# Patient Record
Sex: Female | Born: 1990 | Race: Black or African American | Hispanic: No | Marital: Single | Smoking: Former smoker
Health system: Southern US, Community
[De-identification: ages and names within clinical notes are randomized; demographics above are authoritative.]

## PROBLEM LIST (undated history)

## (undated) DIAGNOSIS — R569 Unspecified convulsions: Secondary | ICD-10-CM

---
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
## 2009-05-10 ENCOUNTER — Inpatient Hospital Stay (HOSPITAL_COMMUNITY): Admission: AD | Admit: 2009-05-10 | Discharge: 2009-05-10 | Payer: Self-pay | Admitting: Obstetrics and Gynecology

## 2009-05-15 ENCOUNTER — Inpatient Hospital Stay (HOSPITAL_COMMUNITY): Admission: RE | Admit: 2009-05-15 | Discharge: 2009-05-15 | Payer: Self-pay | Admitting: Obstetrics and Gynecology

## 2009-05-19 ENCOUNTER — Inpatient Hospital Stay (HOSPITAL_COMMUNITY): Admission: AD | Admit: 2009-05-19 | Discharge: 2009-05-19 | Payer: Self-pay | Admitting: Obstetrics and Gynecology

## 2009-05-20 ENCOUNTER — Inpatient Hospital Stay (HOSPITAL_COMMUNITY): Admission: AD | Admit: 2009-05-20 | Discharge: 2009-05-23 | Payer: Self-pay | Admitting: Obstetrics and Gynecology

## 2009-05-20 ENCOUNTER — Inpatient Hospital Stay (HOSPITAL_COMMUNITY): Admission: AD | Admit: 2009-05-20 | Discharge: 2009-05-20 | Payer: Self-pay | Admitting: Obstetrics and Gynecology

## 2010-07-01 LAB — COMPREHENSIVE METABOLIC PANEL
Albumin: 2.9 g/dL — ABNORMAL LOW (ref 3.5–5.2)
Calcium: 9 mg/dL (ref 8.4–10.5)
Chloride: 103 mEq/L (ref 96–112)
GFR calc Af Amer: >60 mL/min (ref >60)
GFR calc non Af Amer: >60 mL/min (ref >60)
Glucose, Bld: 101 mg/dL — ABNORMAL HIGH (ref 70–99)
Potassium: 3.8 mEq/L (ref 3.5–5.1)
Sodium: 132 mEq/L — ABNORMAL LOW (ref 135–145)
Total Bilirubin: 0.4 mg/dL (ref 0.3–1.2)
Total Protein: 6.4 g/dL (ref 6.0–8.3)

## 2010-07-01 LAB — CBC
MCHC: 33.2 g/dL (ref 30.0–36.0)
MCV: 87 fL (ref 78.0–100.0)
Platelets: 230 10*3/uL (ref 150–400)
RDW: 13.4 % (ref 11.5–15.5)
WBC: 12 10*3/uL — ABNORMAL HIGH (ref 4.0–10.5)

## 2010-07-04 LAB — CCBB MATERNAL DONOR DRAW

## 2010-07-04 LAB — COMPREHENSIVE METABOLIC PANEL
ALT: 21 U/L (ref 0–35)
AST: 26 U/L (ref 0–37)
Albumin: 3.3 g/dL — ABNORMAL LOW (ref 3.5–5.2)
Alkaline Phosphatase: 170 U/L — ABNORMAL HIGH (ref 39–117)
GFR calc Af Amer: >60 mL/min (ref >60)
GFR calc non Af Amer: >60 mL/min (ref >60)
Glucose, Bld: 96 mg/dL (ref 70–99)
Total Bilirubin: 0.1 mg/dL — ABNORMAL LOW (ref 0.3–1.2)
Total Protein: 7.1 g/dL (ref 6.0–8.3)

## 2010-07-04 LAB — CBC
HCT: 38 % (ref 36.0–46.0)
MCV: 88.6 fL (ref 78.0–100.0)
Platelets: 200 10*3/uL (ref 150–400)
Platelets: 258 10*3/uL (ref 150–400)
RBC: 3.6 MIL/uL — ABNORMAL LOW (ref 3.87–5.11)
RBC: 4.32 MIL/uL (ref 3.87–5.11)
RDW: 14.3 % (ref 11.5–15.5)
WBC: 20.2 10*3/uL — ABNORMAL HIGH (ref 4.0–10.5)

## 2010-07-04 LAB — RPR: RPR Ser Ql: NONREACTIVE

## 2010-07-04 LAB — LACTATE DEHYDROGENASE: LDH: 174 U/L (ref 94–250)

## 2010-07-04 LAB — URIC ACID: Uric Acid, Serum: 4.3 mg/dL (ref 2.4–7.0)

## 2010-07-22 LAB — URINALYSIS, ROUTINE W REFLEX MICROSCOPIC
Bilirubin Urine: NEGATIVE
Glucose, UA: NEGATIVE mg/dL
Ketones, ur: >80 mg/dL — AB
Specific Gravity, Urine: 1.028 (ref 1.005–1.030)
pH: 6.5 (ref 5.0–8.0)

## 2010-07-22 LAB — PREGNANCY, URINE: Preg Test, Ur: POSITIVE

## 2010-07-22 LAB — URINE MICROSCOPIC-ADD ON

## 2010-07-22 LAB — WET PREP, GENITAL

## 2010-08-21 ENCOUNTER — Emergency Department (HOSPITAL_COMMUNITY)
Admission: EM | Admit: 2010-08-21 | Discharge: 2010-08-21 | Disposition: A | Discharge status: Home / Self Care | Payer: Self-pay | Attending: Emergency Medicine | Admitting: Emergency Medicine

## 2010-08-21 DIAGNOSIS — A5903 Trichomonal cystitis and urethritis: Secondary | ICD-10-CM | POA: Insufficient documentation

## 2010-08-21 DIAGNOSIS — N949 Unspecified condition associated with female genital organs and menstrual cycle: Secondary | ICD-10-CM | POA: Insufficient documentation

## 2010-08-21 LAB — URINE MICROSCOPIC-ADD ON

## 2010-08-21 LAB — URINALYSIS, ROUTINE W REFLEX MICROSCOPIC
Bilirubin Urine: NEGATIVE
Glucose, UA: NEGATIVE mg/dL
Specific Gravity, Urine: 1.021 (ref 1.005–1.030)
Urobilinogen, UA: 0.2 mg/dL (ref 0.0–1.0)
pH: 7 (ref 5.0–8.0)

## 2011-08-08 ENCOUNTER — Emergency Department (INDEPENDENT_AMBULATORY_CARE_PROVIDER_SITE_OTHER)
Admission: EM | Admit: 2011-08-08 | Discharge: 2011-08-08 | Disposition: A | Discharge status: Home / Self Care | Payer: Self-pay | Source: Home / Self Care | Attending: Emergency Medicine | Admitting: Emergency Medicine

## 2011-08-08 ENCOUNTER — Emergency Department (INDEPENDENT_AMBULATORY_CARE_PROVIDER_SITE_OTHER): Payer: Self-pay

## 2011-08-08 ENCOUNTER — Encounter (HOSPITAL_COMMUNITY): Payer: Self-pay | Admitting: Emergency Medicine

## 2011-08-08 DIAGNOSIS — S0033XA Contusion of nose, initial encounter: Secondary | ICD-10-CM

## 2011-08-08 DIAGNOSIS — S1093XA Contusion of unspecified part of neck, initial encounter: Secondary | ICD-10-CM

## 2011-08-08 DIAGNOSIS — S0003XA Contusion of scalp, initial encounter: Secondary | ICD-10-CM

## 2011-08-08 MED ORDER — IBUPROFEN 600 MG PO TABS: 600.0000 mg | ORAL_TABLET | Freq: Four times a day (QID) | ORAL | Status: AC | PRN

## 2011-08-08 MED ORDER — HYDROCODONE-ACETAMINOPHEN 5-325 MG PO TABS: 2.0000 | ORAL_TABLET | ORAL | Status: AC | PRN
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
## 2011-08-08 NOTE — ED Notes (Signed)
Pt spoke with social worker per [facility] and given resources to f/u with

## 2011-08-08 NOTE — Discharge Instructions (Signed)
Please use resources that the social worker gave you. Return here if you've a fever above 100.4, severe headache, trouble seeing, or for any other concerns.  Call the family practice center on the 1st day of each month. they take the first 60 people as patients. you do not need insurance to be seen.   Go to www.goodrx.com to look up your medications. This will give you a list of where you can find your prescriptions at the most affordable prices.   RESOURCE GUIDE   No Primary Care Doctor Call [facility]  [phone] Other agencies that provide inexpensive medical care    [facility]  [phone]    [facility]  [phone]    [facility]  [phone]    [facility]  [phone] [address] [place] [address]    [facility]  [phone]    [facility]  [phone] [facility] [phone]   [address] [place] Suite [place], [place] [address]   [facility] Resources  [facility]     [employer]                          [facility] [address] [place]                       [address]      [address] [place] [address]   [phone] (After Hours)

## 2012-08-08 ENCOUNTER — Encounter (HOSPITAL_COMMUNITY): Payer: Self-pay | Admitting: *Deleted

## 2012-08-08 ENCOUNTER — Emergency Department (HOSPITAL_COMMUNITY)
Admission: EM | Admit: 2012-08-08 | Discharge: 2012-08-08 | Disposition: A | Discharge status: Home / Self Care | Payer: Self-pay | Attending: Emergency Medicine | Admitting: Emergency Medicine

## 2012-08-08 DIAGNOSIS — F17200 Nicotine dependence, unspecified, uncomplicated: Secondary | ICD-10-CM | POA: Insufficient documentation

## 2012-08-08 DIAGNOSIS — H109 Unspecified conjunctivitis: Secondary | ICD-10-CM | POA: Insufficient documentation

## 2012-08-08 DIAGNOSIS — H5789 Other specified disorders of eye and adnexa: Secondary | ICD-10-CM | POA: Insufficient documentation

## 2012-08-08 MED ORDER — TOBRAMYCIN 0.3 % OP OINT
TOPICAL_OINTMENT | Freq: Four times a day (QID) | OPHTHALMIC | Status: DC
  Administered 2012-08-08: 22:00:00 via OPHTHALMIC

## 2012-08-08 NOTE — ED Notes (Signed)
Red irritated  Lt eye for 2 days..  Not itching

## 2012-08-08 NOTE — ED Notes (Signed)
The patient is AOx4 and comfortable with her discharge instructions. 

## 2012-08-08 NOTE — ED Provider Notes (Signed)
History    This chart was scribed for Clinton Sawyer, a non-physician practitioner working with Raeford Razor, MD by Lewanda Rife, ED Scribe. This patient was seen in room TR04C/TR04C and the patient's care was started at 2125.     CSN: [id number]  Arrival date & time 08/08/12  1859   First MD Initiated Contact with Patient 08/08/12 2056      Chief Complaint  Patient presents with  . Eye Pain    (Consider location/radiation/quality/duration/timing/severity/associated sxs/prior treatment) HPI Wendy Stephenson is a 22 y.o. female who presents to the Emergency Department complaining of constant moderate left eye drainage onset last night. Pt describes drainage as green. Pt denies any pain. Pt reports left eye is itchy and has been rubbing her eye. Pt denies sick contacts. Pt reports trying visine with no relief of symptoms.    History reviewed. No pertinent past medical history.  History reviewed. No pertinent past surgical history.  No family history on file.  History  Substance Use Topics  . Smoking status: Current Every Day Smoker  . Smokeless tobacco: Not on file  . Alcohol Use: No    OB History   Grav Para Term Preterm Abortions TAB SAB Ect Mult Living                  Review of Systems A complete 10 system review of systems was obtained and all systems are negative except as noted in the HPI and PMH.   Allergies  Review of patient's allergies indicates no known allergies.  Home Medications   Current Outpatient Rx  Name  Route  Sig  Dispense  Refill  . Hyprom-Naphaz-Polysorb-Zn Sulf (CLEAR EYES COMPLETE) SOLN   Both Eyes   Place 1 drop into both eyes daily as needed. For redness or irritation           BP 158/77  Pulse 87  Temp(Src) 98.9 F (37.2 C) (Oral)  Resp 20  Ht 5\' 1"  (1.549 m)  Wt 178 lb (80.74 kg)  BMI 33.65 kg/m2  SpO2 97%  Physical Exam  Nursing note and vitals reviewed. Constitutional: She is oriented to person, place, and  time. She appears well-developed and well-nourished. No distress.  HENT:  Head: Normocephalic and atraumatic.  Eyes: EOM are normal. Pupils are equal, round, and reactive to light. Right eye exhibits no discharge. Left eye exhibits discharge (purulent drainage ). Right conjunctiva is not injected. Left conjunctiva is injected.  Neck: Neck supple. No tracheal deviation present.  Cardiovascular: Normal rate.   Pulmonary/Chest: Effort normal. No respiratory distress.  Musculoskeletal: Normal range of motion.  Neurological: She is alert and oriented to person, place, and time.  Skin: Skin is warm and dry.  Psychiatric: She has a normal mood and affect. Her behavior is normal.    ED Course  Procedures (including critical care time) Medications  tobramycin (TOBREX) 0.3 % ophthalmic ointment (not administered)    Labs Reviewed - No data to display No results found.   1. Conjunctivitis       MDM  22 y/o female with conjunctivitis. Rx tobrex eye drops. Advised warm compresses. Infection care and precautions discussed. Patient states understanding of plan and is agreeable.     I personally performed the services described in this documentation, which was scribed in my presence. The recorded information has been reviewed and is accurate.     Trevor Mace, PA-C 08/08/12 2135

## 2012-08-10 NOTE — ED Provider Notes (Signed)
Medical screening examination/treatment/procedure(s) were performed by non-physician practitioner and as supervising physician I was immediately available for consultation/collaboration.  Nathan Moctezuma, MD 08/10/12 0134 

## 2013-11-07 MED ORDER — CETIRIZINE 10 MG TAB
10 mg | ORAL_TABLET | Freq: Every day | ORAL | Status: AC
Start: 2013-11-07 — End: ?

## 2013-11-07 MED ORDER — TRIAMCINOLONE ACETONIDE 0.1 % OINTMENT
0.1 % | Freq: Two times a day (BID) | CUTANEOUS | Status: AC
Start: 2013-11-07 — End: ?

## 2013-11-07 NOTE — ED Notes (Signed)
Pt reports right upper arm swelling and irritation after being stung yesterday.

## 2013-11-07 NOTE — ED Provider Notes (Signed)
HPI Comments: Patient states she was stung by a bee on her right arm yesterday in the afternoon.  She states it's a little bit swollen today.  She is not short of breath, no chest pain no hives.  This is never happened before.  There is no pain to the area but it is itching.  Patient is ambulatory to the room and talking on her cell phone in no distress.    Patient is a 23 y.o. female presenting with bee sting. The history is provided by the patient.   Bee sting   The incident occurred yesterday.        History reviewed. No pertinent past medical history.     History reviewed. No pertinent past surgical history.      History reviewed. No pertinent family history.     History     Social History   ??? Marital Status: SINGLE     Spouse Name: N/A     Number of Children: N/A   ??? Years of Education: N/A     Occupational History   ??? Not on file.     Social History Main Topics   ??? Smoking status: Current Every Day Smoker -- 0.25 packs/day   ??? Smokeless tobacco: Not on file   ??? Alcohol Use: Yes   ??? Drug Use: No   ??? Sexual Activity: Not on file     Other Topics Concern   ??? Not on file     Social History Narrative   ??? No narrative on file                  ALLERGIES: Review of patient's allergies indicates no known allergies.      Review of Systems   Constitutional: Negative.    HENT: Negative.    Eyes: Negative.    Respiratory: Negative.    Cardiovascular: Negative.    Gastrointestinal: Negative.    Genitourinary: Negative.    Musculoskeletal: Negative.    Skin: Positive for color change.   Neurological: Negative.    Psychiatric/Behavioral: Negative.    All other systems reviewed and are negative.      Filed Vitals:    11/07/13 1021   BP: 150/79   Pulse: 70   Temp: 98.9 ??F (37.2 ??C)   Resp: 16   Height: 5\' 1"  (1.549 m)   Weight: 102.059 kg (225 lb)   SpO2: 99%            Physical Exam   Constitutional: She is oriented to person, place, and time. She appears well-developed and well-nourished.   HENT:    Head: Normocephalic and atraumatic.   Right Ear: External ear normal.   Left Ear: External ear normal.   Nose: Nose normal.   Mouth/Throat: Oropharynx is clear and moist.   Eyes: Conjunctivae and EOM are normal. Pupils are equal, round, and reactive to light.   Neck: Normal range of motion. Neck supple.   Cardiovascular: Normal rate, regular rhythm, normal heart sounds and intact distal pulses.    Pulmonary/Chest: Effort normal and breath sounds normal.   Abdominal: Soft. Bowel sounds are normal.   Musculoskeletal: Normal range of motion.        Right upper arm: She exhibits swelling. She exhibits no tenderness, no bony tenderness, no edema, no deformity and no laceration.        Arms:  Neurological: She is alert and oriented to person, place, and time. She has normal reflexes.   Skin: Skin is  warm and dry.   Psychiatric: She has a normal mood and affect. Her behavior is normal. Judgment and thought content normal.   Nursing note and vitals reviewed.       MDM    Procedures    The patient was observed in the ED.  Ice to the area, use the medication as directed, follow-up with primary care physician for recheck.  ED if worse.  Patient was instructed on the signs and symptoms of anaphylaxis and what to watch for.  I discussed the results of all labs, procedures, radiographs, and treatments with the patient and available family.  Treatment plan is agreed upon and the patient is ready for discharge.  All voiced understanding of the discharge plan and medication instructions or changes as appropriate.  Questions about treatment in the ED were answered.  All were encouraged to return should symptoms worsen or new problems develop.

## 2013-11-07 NOTE — ED Notes (Signed)
Copy of discharge instructions given to patient. Denies any questions at this time.

## 2017-04-27 ENCOUNTER — Emergency Department (HOSPITAL_COMMUNITY)
Admission: EM | Admit: 2017-04-27 | Discharge: 2017-04-27 | Disposition: A | Discharge status: Home / Self Care | Payer: Medicaid Other | Attending: Emergency Medicine | Admitting: Emergency Medicine

## 2017-04-27 ENCOUNTER — Emergency Department (HOSPITAL_COMMUNITY): Payer: Medicaid Other

## 2017-04-27 ENCOUNTER — Other Ambulatory Visit: Payer: Self-pay

## 2017-04-27 ENCOUNTER — Encounter (HOSPITAL_COMMUNITY): Payer: Self-pay | Admitting: Emergency Medicine

## 2017-04-27 DIAGNOSIS — R410 Disorientation, unspecified: Secondary | ICD-10-CM | POA: Diagnosis not present

## 2017-04-27 DIAGNOSIS — R55 Syncope and collapse: Secondary | ICD-10-CM | POA: Diagnosis present

## 2017-04-27 DIAGNOSIS — R569 Unspecified convulsions: Secondary | ICD-10-CM | POA: Diagnosis not present

## 2017-04-27 DIAGNOSIS — F17210 Nicotine dependence, cigarettes, uncomplicated: Secondary | ICD-10-CM | POA: Diagnosis not present

## 2017-04-27 LAB — CBC WITH DIFFERENTIAL/PLATELET
BASOS ABS: 0 10*3/uL (ref 0.0–0.1)
BASOS PCT: 0 %
EOS ABS: 0.1 10*3/uL (ref 0.0–0.7)
EOS PCT: 1 %
HCT: 39.6 % (ref 36.0–46.0)
Hemoglobin: 12.9 g/dL (ref 12.0–15.0)
Lymphocytes Relative: 15 %
Lymphs Abs: 2.8 10*3/uL (ref 0.7–4.0)
MCH: 27.8 pg (ref 26.0–34.0)
MCHC: 32.6 g/dL (ref 30.0–36.0)
MCV: 85.3 fL (ref 78.0–100.0)
Monocytes Absolute: 1.3 10*3/uL — ABNORMAL HIGH (ref 0.1–1.0)
Monocytes Relative: 7 %
Neutro Abs: 14.8 10*3/uL — ABNORMAL HIGH (ref 1.7–7.7)
Neutrophils Relative %: 77 %
PLATELETS: 309 10*3/uL (ref 150–400)
RBC: 4.64 MIL/uL (ref 3.87–5.11)
RDW: 13.3 % (ref 11.5–15.5)
WBC: 19.1 10*3/uL — AB (ref 4.0–10.5)

## 2017-04-27 LAB — BASIC METABOLIC PANEL
Anion gap: 12 (ref 5–15)
BUN: 6 mg/dL (ref 6–20)
CHLORIDE: 102 mmol/L (ref 101–111)
CO2: 23 mmol/L (ref 22–32)
Calcium: 9.1 mg/dL (ref 8.9–10.3)
Creatinine, Ser: 0.88 mg/dL (ref 0.44–1.00)
Glucose, Bld: 115 mg/dL — ABNORMAL HIGH (ref 65–99)
Potassium: 3.7 mmol/L (ref 3.5–5.1)
SODIUM: 137 mmol/L (ref 135–145)

## 2017-04-27 LAB — I-STAT BETA HCG BLOOD, ED (MC, WL, AP ONLY)

## 2017-04-27 NOTE — ED Notes (Signed)
Patient transported to CT 
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
## 2017-04-27 NOTE — ED Notes (Signed)
Seizure pads in place on bedrails.

## 2017-04-27 NOTE — ED Notes (Signed)
Patient ambulated to restroom with family member. Gait slow, steady. No dizziness reported.

## 2017-04-27 NOTE — ED Triage Notes (Addendum)
Patient arrived to ED via [facility] from home. EMS reports:  EMS called for unwitnessed fall. Possible seizure. No history of seizures.  When EMS arrived, patient confused at first. Unable to answer questions. Diaphoretic. C/o feeling hot. Attempting to remove clothing.  PERRLA. Lungs clear. NSR on monitor.  BP 120/60, Pulse 64, 100% on room air. CBG 108.

## 2017-06-24 ENCOUNTER — Ambulatory Visit: Payer: Medicaid Other | Admitting: Neurology

## 2017-06-24 ENCOUNTER — Telehealth: Payer: Self-pay | Admitting: *Deleted

## 2017-06-24 NOTE — Telephone Encounter (Signed)
No showed new patient appointment. 

## 2017-06-25 ENCOUNTER — Encounter: Payer: Self-pay | Admitting: Neurology

## 2017-07-09 ENCOUNTER — Other Ambulatory Visit: Payer: Self-pay

## 2017-07-09 ENCOUNTER — Emergency Department (HOSPITAL_COMMUNITY)
Admission: EM | Admit: 2017-07-09 | Discharge: 2017-07-09 | Disposition: A | Discharge status: Home / Self Care | Payer: Medicaid Other | Attending: Emergency Medicine | Admitting: Emergency Medicine

## 2017-07-09 ENCOUNTER — Emergency Department (HOSPITAL_COMMUNITY): Payer: Medicaid Other

## 2017-07-09 ENCOUNTER — Encounter (HOSPITAL_COMMUNITY): Payer: Self-pay

## 2017-07-09 DIAGNOSIS — R569 Unspecified convulsions: Secondary | ICD-10-CM

## 2017-07-09 DIAGNOSIS — F1210 Cannabis abuse, uncomplicated: Secondary | ICD-10-CM | POA: Insufficient documentation

## 2017-07-09 DIAGNOSIS — F17210 Nicotine dependence, cigarettes, uncomplicated: Secondary | ICD-10-CM | POA: Insufficient documentation

## 2017-07-09 DIAGNOSIS — Z5320 Procedure and treatment not carried out because of patient's decision for unspecified reasons: Secondary | ICD-10-CM | POA: Insufficient documentation

## 2017-07-09 LAB — CBC
HEMATOCRIT: 40.3 % (ref 36.0–46.0)
Hemoglobin: 13 g/dL (ref 12.0–15.0)
MCH: 28.2 pg (ref 26.0–34.0)
MCHC: 32.3 g/dL (ref 30.0–36.0)
MCV: 87.4 fL (ref 78.0–100.0)
Platelets: 305 10*3/uL (ref 150–400)
RBC: 4.61 MIL/uL (ref 3.87–5.11)
RDW: 13.9 % (ref 11.5–15.5)
WBC: 20 10*3/uL — AB (ref 4.0–10.5)

## 2017-07-09 LAB — BASIC METABOLIC PANEL
ANION GAP: 7 (ref 5–15)
BUN: 9 mg/dL (ref 6–20)
CALCIUM: 9.5 mg/dL (ref 8.9–10.3)
CHLORIDE: 105 mmol/L (ref 101–111)
CO2: 28 mmol/L (ref 22–32)
Creatinine, Ser: 0.86 mg/dL (ref 0.44–1.00)
GFR calc non Af Amer: >60 mL/min (ref >60)
Glucose, Bld: 98 mg/dL (ref 65–99)
POTASSIUM: 3.4 mmol/L — AB (ref 3.5–5.1)
Sodium: 140 mmol/L (ref 135–145)

## 2017-07-09 LAB — I-STAT BETA HCG BLOOD, ED (MC, WL, AP ONLY): I-stat hCG, quantitative: <5 m[IU]/mL (ref <5)

## 2017-07-09 MED ORDER — SODIUM CHLORIDE 0.9 % IV BOLUS: 1000.0000 mL | Freq: Once | INTRAVENOUS | Status: DC

## 2017-07-09 MED ORDER — LEVETIRACETAM 500 MG PO TABS: 500.0000 mg | ORAL_TABLET | Freq: Two times a day (BID) | ORAL | 0 refills | Status: DC

## 2017-07-09 MED ORDER — GADOBENATE DIMEGLUMINE 529 MG/ML IV SOLN
20.0000 mL | Freq: Once | INTRAVENOUS | Status: AC | PRN
  Administered 2017-07-09: 20 mL via INTRAVENOUS

## 2017-07-09 NOTE — Discharge Instructions (Addendum)
Today your brain MRI did not show any cause for your symptoms.  It is very important that you follow up with neurology.  Please do not drive or operate heavy machinery.  Please do not bathe/shower for go swimming alone.  Please do not work at heights.  Today we discussed the risks of leaving AGAINST MEDICAL ADVICE and you stated your understanding.

## 2017-07-09 NOTE — ED Triage Notes (Signed)
EMS reports from home, witnessed seizure, no fall, seizure a month ago and [facility]. No seizure medication or current Dx.  BP 125/77 HR 94 Resp 18 Sp02 96 RA CBG 116

## 2017-07-09 NOTE — ED Notes (Signed)
Patient left AMA. Patient provided with written prescription from [facility] and home instructions. Patient verbalized understanding. Patient ambulated without assistance off of unit.

## 2017-07-09 NOTE — ED Provider Notes (Signed)
[facility]-EMERGENCY DEPT Provider Note   CSN: [id number] Arrival date & time: 07/09/17  1437     History   Chief Complaint Chief Complaint  Patient presents with  . Seizures    HPI Wendy Stephenson is a 27 y.o. female who presents today for evaluation of a whitnessed seizure.  After obtaining patient's permission I spoke with her boyfriend Wendy Stephenson who reports that she had an episode of full body shaking.  She did not fall or have any trauma.  She reports that she has a small area of pain on the left side of her tongue.  Denies any SOB, HA.  She reports that she was sleeping and woke up feeling odd and "the next thing I knew paramedics were everywhere."    She had her first seizure on 04/27/17 when she was seen at the emergency room at [facility] and evaluated.  She reports that she did not go to her neurology follow up appointment because she felt ok.  She reports daily marijuana use, denies any other drugs.  She denies any recent trauma.  She was not started on anti-epileptics after her last seizure.    Chart review shows that she had a CT head obtained at the time which was normal.    HPI  History reviewed. No pertinent past medical history.  There are no active problems to display for this patient.   History reviewed. No pertinent surgical history.   OB History   None      Home Medications    Prior to Admission medications   Medication Sig Start Date End Date Taking? Authorizing Provider  levETIRAcetam (KEPPRA) 500 MG tablet Take 1 tablet (500 mg total) by mouth 2 (two) times daily. 07/09/17 08/08/17  Cristina Gong, Stephenson-C    Family History History reviewed. No pertinent family history.  Social History Social History   Tobacco Use  . Smoking status: Current Every Day Smoker  . Smokeless tobacco: Never Used  Substance Use Topics  . Alcohol use: No  . Drug use: No     Allergies   Patient has no known allergies.   Review of Systems Review of  Systems  Constitutional: Negative for chills and fever.       Felt warm before seizure.   HENT: Negative for congestion.   Eyes: Negative for visual disturbance.  Respiratory: Negative for cough and shortness of breath.   Gastrointestinal: Negative for abdominal pain.  Genitourinary: Negative for dysuria.  Musculoskeletal: Negative for back pain and neck pain.  Skin: Negative for rash.  Neurological: Positive for seizures. Negative for weakness, light-headedness, numbness and headaches.  Psychiatric/Behavioral: Negative for confusion.  All other systems reviewed and are negative.    Physical Exam Updated Vital Signs BP (!) 152/77 (BP Location: Left Arm)   Pulse 86   Temp 98.4 F (36.9 C)   Resp 18   Ht 5\' 1"  (1.549 m)   Wt 104.3 kg (230 lb)   SpO2 100%   BMI 43.46 kg/m   Physical Exam  Constitutional: She is oriented to person, place, and time. She appears well-developed and well-nourished. No distress.  HENT:  Head: Normocephalic and atraumatic.  Mouth/Throat: Oropharynx is clear and moist.  Very slight wound on left lateral tongue.    Eyes: Conjunctivae are normal. Right eye exhibits no discharge. Left eye exhibits no discharge. No scleral icterus.  Neck: Normal range of motion. Neck supple. No JVD present.  Cardiovascular: Normal rate, regular rhythm and intact  distal pulses.  No murmur heard. Pulmonary/Chest: Effort normal and breath sounds normal. No stridor. No respiratory distress.  Abdominal: Soft. Bowel sounds are normal. She exhibits no distension.  Musculoskeletal: She exhibits no edema or deformity.  Neurological: She is alert and oriented to person, place, and time. She exhibits normal muscle tone.  Mental Status:  Alert, oriented, thought content appropriate, able to give a coherent history. Speech fluent without evidence of aphasia. Able to follow 2 step commands without difficulty.  Cranial Nerves:  II:  Peripheral visual fields grossly normal, pupils  equal, round, reactive to light III,IV, VI: ptosis not present, extra-ocular motions intact bilaterally  V,VII: smile symmetric, facial light touch sensation equal VIII: hearing grossly normal to voice  X: uvula elevates symmetrically  XI: bilateral shoulder shrug symmetric and strong XII: midline tongue extension without fassiculations Motor:  Normal tone. 5/5 in upper and lower extremities bilaterally including strong and equal grip strength and dorsiflexion/plantar flexion Cerebellar: normal finger-to-nose with bilateral upper extremities Gait: normal gait and balance CV: distal pulses palpable throughout    Skin: Skin is warm and dry. She is not diaphoretic.  Psychiatric: She has a normal mood and affect. Her behavior is normal.  Nursing note and vitals reviewed.    ED Treatments / Results  Labs (all labs ordered are listed, but only abnormal results are displayed) Labs Reviewed  BASIC METABOLIC PANEL - Abnormal; Notable for the following components:      Result Value   Potassium 3.4 (*)    All other components within normal limits  CBC - Abnormal; Notable for the following components:   WBC 20.0 (*)    All other components within normal limits  I-STAT BETA HCG BLOOD, ED ([facility], [facility], [facility] ONLY)    EKG None  Radiology Mr Laqueta Jean And Wo Contrast  Result Date: 07/09/2017 CLINICAL DATA:  Two seizures within 1 week. EXAM: MRI HEAD WITHOUT AND WITH CONTRAST TECHNIQUE: Multiplanar, multiecho pulse sequences of the brain and surrounding structures were obtained without and with intravenous contrast. CONTRAST:  20mL MULTIHANCE GADOBENATE DIMEGLUMINE 529 MG/ML IV SOLN COMPARISON:  CT HEAD April 27, 2017 FINDINGS: INTRACRANIAL CONTENTS: No reduced diffusion to suggest acute ischemia or hyperacute demyelination. No susceptibility artifact to suggest hemorrhage. The ventricles and sulci are normal for patient's age. No suspicious parenchymal signal, masses, mass effect. No abnormal  intraparenchymal or extra-axial enhancement. No abnormal extra-axial fluid collections. No extra-axial masses. Motion degraded evaluation of the hippocampi with relatively symmetric size and signal. VASCULAR: Normal major intracranial vascular flow voids present at skull base. SKULL AND UPPER CERVICAL SPINE: No abnormal sellar expansion. No suspicious calvarial bone marrow signal. Craniocervical junction maintained. SINUSES/ORBITS: Small RIGHT maxillary mucosal retention cysts without paranasal sinus air-fluid levels. Mastoid air cells are well aerated.The included ocular globes and orbital contents are non-suspicious. OTHER: None. IMPRESSION: Normal MRI of the head with and without contrast. Electronically Signed   By: Awilda Metro M.D.   On: 07/09/2017 19:09    Procedures Procedures (including critical care time)  Medications Ordered in ED Medications  gadobenate dimeglumine (MULTIHANCE) injection 20 mL (20 mLs Intravenous Contrast Given 07/09/17 1847)     Initial Impression / Assessment and Plan / ED Course  I have reviewed the triage vital signs and the nursing notes.  Pertinent labs & imaging results that were available during my care of the patient were reviewed by me and considered in my medical decision making (see chart for details).  Clinical Course as of Mar  [id number]  Thu Jul 09, 2017  1604 Spoke with Wendy Budndre, reports was laying down in bed and she got up then she said she wasn't feeling good, felt like about to pass out, sat down and started shaking. He threw cold water on her face.  He was educated on basic seizure first aid including not throwing water on someone face.  She did not fall.    [EH]  1647 Spoek with Wendy Stephenson from neuro who requested addition of UDS, MRI brain with and with out contrast and EEG, reports can go home on keppra 500 BID if those are normal.  After discussion with Supervising physician who feels this is an inpatient work up and will consult hospitalist  for admission.    [EH]  [phone] Spoke with patient who does not wish for hospitalization.  She wishes to go home tonight and not be away from her son.  I informed her this would be leaving AMA.  She agreed to wait "for a while" and see what tests we can get done here in the Ed.    [EH]  1948 Spoke with patient, went over risks of leaving AMA and she stated her understanding.    [EH]    Clinical Course User Index [EH] Cristina GongHammond, Derwood Becraft W, Stephenson-C   Wendy Stephenson presents today for evaluation of seizure-like activity.  This was witnessed by her boyfriend.  She had a similar episode back in January where it was concerning for a first-time seizure.  As this is her second seizure I consult neurology.  Neurology requested that MRI brain and EEG be obtained and that she be started on Keppra p.o.  This workup to be on the extent of what we regularly offered in the emergency room and I discussed the need for admission with patient.  She refuses to stay overnight, stating that she does not want to be away from her son.  We discussed the potential risks of this and not being fully evaluated and she stated her understanding.  MRI was able to be obtained which did not show any acute abnormalities.  Still recommended admission to obtain EEG, however patient continued to decline.  She was made aware of the potential risks of this decision, including but not limited to death, severe disability, chronic pain, injury, or any complications.  She stated her understanding of this, she was given seizure and syncope with precautions, state understanding.  She was advised not to drive, operate heavy machinery, work at heights or put herself in a situation where if she were to have a seizure or lose consciousness it could cause harm to her or someone else.  She stated her understanding of this.  Patient was informed of the need to follow-up with neurology and stated her understanding.  She signed out AGAINST MEDICAL ADVICE despite  verbalizing understanding of the potential risks of this decision.  She was started on keppra 500mg  po BID per neurology recommendation.    Final Clinical Impressions(s) / ED Diagnoses   Final diagnoses:  Seizure-like activity [facility](HCC)    ED Discharge Orders        Ordered    levETIRAcetam (KEPPRA) 500 MG tablet  2 times daily     07/09/17 1952       Norman ClayHammond, Sedric Guia W, Stephenson-C 07/10/17 0007    Melene PlanFloyd, Dan, DO 07/10/17 1526

## 2017-07-23 ENCOUNTER — Ambulatory Visit: Payer: Medicaid Other | Admitting: Clinical

## 2017-08-21 ENCOUNTER — Emergency Department (HOSPITAL_COMMUNITY)
Admission: EM | Admit: 2017-08-21 | Discharge: 2017-08-22 | Disposition: A | Discharge status: Home / Self Care | Payer: Medicaid Other | Attending: Emergency Medicine | Admitting: Emergency Medicine

## 2017-08-21 DIAGNOSIS — G40909 Epilepsy, unspecified, not intractable, without status epilepticus: Secondary | ICD-10-CM | POA: Diagnosis present

## 2017-08-21 DIAGNOSIS — Z79899 Other long term (current) drug therapy: Secondary | ICD-10-CM | POA: Diagnosis not present

## 2017-08-21 MED ORDER — PHENYTOIN SODIUM EXTENDED 100 MG PO CAPS: 100.0000 mg | ORAL_CAPSULE | Freq: Two times a day (BID) | ORAL | 0 refills | Status: DC

## 2017-08-21 MED ORDER — SODIUM CHLORIDE 0.9 % IV SOLN
1000.0000 mg | Freq: Once | INTRAVENOUS | Status: AC
  Administered 2017-08-22: 1000 mg via INTRAVENOUS
  Filled 2017-08-21: qty 20

## 2017-08-21 NOTE — ED Provider Notes (Signed)
[facility]-EMERGENCY DEPT Provider Note   CSN: [id number] Arrival date & time: 08/21/17  2219     History   Chief Complaint Chief Complaint  Patient presents with  . Seizures    HPI Wendy Stephenson is a 27 y.o. female.  27 year old female with no history of seizure disorder presents after witnessed seizure prior to arrival.  Family describes generalized tonic-clonic activity lasted for less than a minute.  She did bite her tongue but did not have any bowel or bladder incontinence.  Patient has been prescribed Keppra but has been noncompliant due to itching that she developed on her feet when taken medication.  Patient tried to medicate with Benadryl without response.  Denies any daily alcohol use.  Is back to baseline at this time.  Denies any headache.  No other medications being taken currently.     No past medical history on file.  There are no active problems to display for this patient.   No past surgical history on file.   OB History   None      Home Medications    Prior to Admission medications   Medication Sig Start Date End Date Taking? Authorizing Provider  levETIRAcetam (KEPPRA) 500 MG tablet Take 1 tablet (500 mg total) by mouth 2 (two) times daily. 07/09/17 08/08/17  Cristina Gong, PA-C    Family History No family history on file.  Social History Social History   Tobacco Use  . Smoking status: Current Every Day Smoker  . Smokeless tobacco: Never Used  Substance Use Topics  . Alcohol use: No  . Drug use: No     Allergies   Patient has no known allergies.   Review of Systems Review of Systems  All other systems reviewed and are negative.    Physical Exam Updated Vital Signs BP (!) 127/98 (BP Location: Right Arm)   Pulse (!) 102   Temp 98.4 F (36.9 C) (Oral)   Resp 15   Ht 1.549 m ( )   Wt 97.5 kg (215 lb)   SpO2 100%   BMI 40.62 kg/m   Physical Exam  Constitutional: She is oriented to person,  place, and time. She appears well-developed and well-nourished.  Non-toxic appearance. No distress.  HENT:  Head: Normocephalic and atraumatic.  Mouth/Throat:    Eyes: Pupils are equal, round, and reactive to light. Conjunctivae, EOM and lids are normal.  Neck: Normal range of motion. Neck supple. No tracheal deviation present. No thyroid mass present.  Cardiovascular: Normal rate, regular rhythm and normal heart sounds. Exam reveals no gallop.  No murmur heard. Pulmonary/Chest: Effort normal and breath sounds normal. No stridor. No respiratory distress. She has no decreased breath sounds. She has no wheezes. She has no rhonchi. She has no rales.  Abdominal: Soft. Normal appearance and bowel sounds are normal. She exhibits no distension. There is no tenderness. There is no rebound and no CVA tenderness.  Musculoskeletal: Normal range of motion. She exhibits no edema or tenderness.  Neurological: She is alert and oriented to person, place, and time. She has normal strength. No cranial nerve deficit or sensory deficit. She displays no seizure activity. GCS eye subscore is 4. GCS verbal subscore is 5. GCS motor subscore is 6.  Skin: Skin is warm and dry. No abrasion and no rash noted.  Psychiatric: She has a normal mood and affect. Her speech is normal and behavior is normal.  Nursing note and vitals reviewed.    ED  Treatments / Results  Labs (all labs ordered are listed, but only abnormal results are displayed) Labs Reviewed - No data to display  EKG None  Radiology No results found.  Procedures Procedures (including critical care time)  Medications Ordered in ED Medications  phenytoin (DILANTIN) 1,000 mg in sodium chloride 0.9 % 250 mL IVPB (has no administration in time range)     Initial Impression / Assessment and Plan / ED Course  I have reviewed the triage vital signs and the nursing notes.  Pertinent labs & imaging results that were available during my care of the  patient were reviewed by me and considered in my medical decision making (see chart for details).     Patient to be loaded with Dilantin and then prescribed same medication.  She has a follow-up appoint with neurology in 1 week.  Encouraged to take medications as directed  Final Clinical Impressions(s) / ED Diagnoses   Final diagnoses:  None    ED Discharge Orders    None       Lorre Nick, MD 08/21/17 2318

## 2017-08-21 NOTE — Discharge Instructions (Signed)
Keep your appointment with your neurologist for this month and take your medications as directed

## 2017-08-21 NOTE — ED Provider Notes (Signed)
  Physical Exam  BP (!) 127/98 (BP Location: Right Arm)   Pulse (!) 102   Temp 98.4 F (36.9 C) (Oral)   Resp 15   Ht  (1.549 m)   Wt 97.5 kg (215 lb)   SpO2 100%   BMI 40.62 kg/m   Physical Exam  ED Course/Procedures     Procedures  MDM   Assuming care of patient from Dr. Freida Busman.   Patient in the ED for seizure. Pt recently diagnosed with seizure disorder and she has not bee taking keppra due to hypersensitivity.  Important pending results are none.  According to Dr. Freida Busman, plan is to d/c patient after dilantin load.   Patient had no complains, no concerns from the nursing side. Will continue to monitor.        Derwood Kaplan, MD 08/21/17 2350

## 2017-08-21 NOTE — ED Triage Notes (Signed)
Pt BIB [facility] from home. Pt had witnessed grand mal seizure that lasted 5 minutes. Pt was post-ictal upon EMS arrival. She became CAOx4 en route to ED. Pt has hx of seizures and is prescribed keppra for seizures. She is non compliant with medication due to breakouts for the last 2 months. This is her 3rd seizure this month, she initially did not want to come but family talked her into it. No trauma noted to pt. Pt ambulatory upon arrival.

## 2017-09-21 ENCOUNTER — Telehealth: Payer: Self-pay | Admitting: *Deleted

## 2017-09-21 ENCOUNTER — Ambulatory Visit: Payer: Medicaid Other | Admitting: Neurology

## 2017-09-21 NOTE — Telephone Encounter (Signed)
No showed new patient appointment. 

## 2017-09-22 ENCOUNTER — Encounter: Payer: Self-pay | Admitting: Neurology

## 2017-09-23 ENCOUNTER — Encounter: Payer: Self-pay | Admitting: Neurology

## 2017-10-01 ENCOUNTER — Encounter (HOSPITAL_COMMUNITY): Payer: Self-pay

## 2017-10-01 ENCOUNTER — Encounter: Payer: Self-pay | Admitting: Neurology

## 2017-10-01 ENCOUNTER — Emergency Department (HOSPITAL_COMMUNITY)
Admission: EM | Admit: 2017-10-01 | Discharge: 2017-10-01 | Disposition: A | Discharge status: Home / Self Care | Payer: Medicaid Other | Attending: Emergency Medicine | Admitting: Emergency Medicine

## 2017-10-01 ENCOUNTER — Other Ambulatory Visit: Payer: Self-pay

## 2017-10-01 DIAGNOSIS — F17210 Nicotine dependence, cigarettes, uncomplicated: Secondary | ICD-10-CM | POA: Insufficient documentation

## 2017-10-01 DIAGNOSIS — R569 Unspecified convulsions: Secondary | ICD-10-CM | POA: Diagnosis present

## 2017-10-01 LAB — CBC WITH DIFFERENTIAL/PLATELET
BASOS ABS: 0 10*3/uL (ref 0.0–0.1)
Basophils Relative: 0 %
EOS ABS: 0.1 10*3/uL (ref 0.0–0.7)
EOS PCT: 1 %
HCT: 37.7 % (ref 36.0–46.0)
Hemoglobin: 12.5 g/dL (ref 12.0–15.0)
Lymphocytes Relative: 19 %
Lymphs Abs: 3.3 10*3/uL (ref 0.7–4.0)
MCH: 28.9 pg (ref 26.0–34.0)
MCHC: 33.2 g/dL (ref 30.0–36.0)
MCV: 87.1 fL (ref 78.0–100.0)
MONO ABS: 1.2 10*3/uL — AB (ref 0.1–1.0)
Monocytes Relative: 7 %
Neutro Abs: 12.5 10*3/uL — ABNORMAL HIGH (ref 1.7–7.7)
Neutrophils Relative %: 73 %
Platelets: 260 10*3/uL (ref 150–400)
RBC: 4.33 MIL/uL (ref 3.87–5.11)
RDW: 13.2 % (ref 11.5–15.5)
WBC: 17.1 10*3/uL — AB (ref 4.0–10.5)

## 2017-10-01 LAB — BASIC METABOLIC PANEL
Anion gap: 9 (ref 5–15)
BUN: 8 mg/dL (ref 6–20)
CALCIUM: 8.7 mg/dL — AB (ref 8.9–10.3)
CO2: 24 mmol/L (ref 22–32)
CREATININE: 0.87 mg/dL (ref 0.44–1.00)
Chloride: 106 mmol/L (ref 101–111)
GFR calc Af Amer: >60 mL/min (ref >60)
GLUCOSE: 121 mg/dL — AB (ref 65–99)
Potassium: 3.7 mmol/L (ref 3.5–5.1)
SODIUM: 139 mmol/L (ref 135–145)

## 2017-10-01 LAB — PHENYTOIN LEVEL, TOTAL: Phenytoin Lvl: <2.5 ug/mL — ABNORMAL LOW (ref 10.0–20.0)

## 2017-10-01 LAB — POC URINE PREG, ED: PREG TEST UR: NEGATIVE

## 2017-10-01 LAB — MAGNESIUM: MAGNESIUM: 2.1 mg/dL (ref 1.7–2.4)

## 2017-10-01 MED ORDER — PHENYTOIN SODIUM EXTENDED 100 MG PO CAPS: 300.0000 mg | ORAL_CAPSULE | Freq: Every day | ORAL | 0 refills | Status: DC

## 2017-10-01 MED ORDER — SODIUM CHLORIDE 0.9 % IV SOLN
1750.0000 mg | Freq: Once | INTRAVENOUS | Status: AC
  Administered 2017-10-01: 1750 mg via INTRAVENOUS
  Filled 2017-10-01: qty 35

## 2017-10-01 NOTE — Consult Note (Signed)
   [facility] Consult Services  Impression and Plan: // Seizure - Etiology unknown as work up with EEG still not done, has appointment to have one  - MRI brain in March normal - Back to baseline and has no complains.  - She did have tongue biting, + post ictal and was out of sleep.  - She was started on Keppra initially but had side effects, so was switched to Dilantin and takes 100 mg BID. She had a breakthrough seizure this morning. Now back to baseline, Dilantin level not detected, she said she is compliant. - I discussed with her options, she might need to be on other than Dilantin, but others ned titration and also VPA is not good option as may get pregnant and already obese.  She said she prefers to stay on Dilantin until follows up in clinic and can switch then. - Load with Dilantin IV 20 mg/kg, continue 300 QHS. - If has more seizures and come back then need admission for EEG inpatient and switch AEDs. - No driving discussed. - Follow up in clinic. - Discussed with ED MD  Please call us back at [phone] with questions.  ---------------------------------------------------------------------  CC: seizure  History of Present Illness:  Patient is a 27 year old woman who presented with breakthrough seizure. She has been having seizures since Jan of this year, she had MRI done which is normal but not EEG, she is scheduled to have one in outpatient. She was started on Keppra initially but had side effects, so was switched to Dilantin and takes 100 mg BID. She had a breakthrough seizure this morning. Now back to baseline, Dilantin level not detected, she said she is compliant. She did have tongue biting, + post ictal and was out of sleep.   Diagnostic Testing: MRI brain from march 2019 [facility]  Exam:  MS: Alert and oriented x 4. Speech fluent, comprehends commands, memory intact, no noticeable neglect CN: PERRLA, visual fields Intact to finger counting, EOMI, no  nystagmus, no ptosis, facial sensation intact, no facial weakness, hearing intact b/l, tongue midline on protrusion.  MOTOR: no drift      Tremor/Abnormal Movements:  Resting tremor: Absent Intention tremor: Absent Postural tremor: Absent  SENSORY: intact to light touch COORD: normal FTN  Medical Decision Making:  - Extensive number of diagnosis or management options are considered above.   - Extensive amount of complex data reviewed.   - High risk of complication and/or morbidity or mortality are associated with differential diagnostic considerations above.  - There may be uncertain outcome and increased probability of prolonged functional impairment or high probability of severe prolonged functional impairment associated with some of these differential diagnosis.   Medical Data Reviewed:  1.Data reviewed include clinical labs, radiology,  Medical Tests;   2.Tests results discussed w/performing or interpreting physician;   3.Obtaining/reviewing old medical records;  4.Obtaining case history from another source;  5.Independent review of image, tracing or specimen.    Patient was informed the Neurology Consult would happen via telehealth (remote video) and consented to receiving care in this manner.

## 2017-10-01 NOTE — ED Notes (Signed)
Pt ambulated to bathroom independently without any problems.

## 2017-10-01 NOTE — ED Notes (Signed)
Pt ambulated back to room from bathroom independently. Pt was hooked back up to monitors and side rails up before leaving the room.

## 2017-10-01 NOTE — ED Provider Notes (Addendum)
[facility]-EMERGENCY DEPT Provider Note   CSN: [id number] Arrival date & time: 10/01/17  4098     History   Chief Complaint Chief Complaint  Patient presents with  . Seizures    HPI GENEVIVE PRINTUP is a 27 y.o. female.  HPI  27 year old female presents with a seizure.  She has a fairly newly diagnosed history of seizures, starting in January.  She is currently on Dilantin.  She states she did not tolerate the Keppra due to feet itching.  She states she has been compliant with her medicines.  She rarely drinks alcohol.  She uses marijuana and smokes but otherwise no illicit drug use.  She currently feels fine.  She states that not long prior to arrival she started to feel very hot and had woken up out of sleep.  She was try to go back to sleep in the next thing she knows she woke up in the ambulance.  Her boyfriend witnessed the seizure and told EMS that lasted about 2 minutes.  She denies any preceding illness and currently feels normal.  No headaches, vomiting, fever, neck pain.  She has not been able to see a neurologist, she was released from [facility] neurology because she missed 2 appointments.  Complains of bilateral tongue pain.  History reviewed. No pertinent past medical history.  There are no active problems to display for this patient.   History reviewed. No pertinent surgical history.   OB History   None      Home Medications    Prior to Admission medications   Medication Sig Start Date End Date Taking? Authorizing Provider  phenytoin (DILANTIN) 100 MG ER capsule Take 3 capsules (300 mg total) by mouth at bedtime. 10/01/17   Pricilla Loveless, MD    Family History No family history on file.  Social History Social History   Tobacco Use  . Smoking status: Current Every Day Smoker  . Smokeless tobacco: Never Used  Substance Use Topics  . Alcohol use: No  . Drug use: No     Allergies   Keppra [levetiracetam]   Review of  Systems Review of Systems  Constitutional: Negative for fever.  Gastrointestinal: Negative for abdominal pain and vomiting.  Musculoskeletal: Negative for neck pain.  Neurological: Negative for weakness, numbness and headaches.  All other systems reviewed and are negative.    Physical Exam Updated Vital Signs BP 129/63 (BP Location: Right Arm)   Pulse 78   Temp 97.8 F (36.6 C) (Oral)   Resp 18   Ht 5\' 1"  (1.549 m)   Wt 105.2 kg (232 lb)   LMP 09/15/2017   SpO2 100%   BMI 43.84 kg/m   Physical Exam  Constitutional: She is oriented to person, place, and time. She appears well-developed and well-nourished. No distress.  obese  HENT:  Head: Normocephalic.  Right Ear: External ear normal.  Left Ear: External ear normal.  Nose: Nose normal.  Minimal bilateral lateral tongue abrasions  Eyes: Pupils are equal, round, and reactive to light. EOM are normal. Right eye exhibits no discharge. Left eye exhibits no discharge.  Neck: Normal range of motion. Neck supple.  Cardiovascular: Normal rate, regular rhythm and normal heart sounds.  Pulmonary/Chest: Effort normal and breath sounds normal.  Abdominal: Soft. She exhibits no distension. There is no tenderness.  Neurological: She is alert and oriented to person, place, and time.  CN 3-12 grossly intact. 5/5 strength in all 4 extremities. Grossly normal sensation. Normal finger to  nose.   Skin: Skin is warm and dry. She is not diaphoretic.  Nursing note and vitals reviewed.    ED Treatments / Results  Labs (all labs ordered are listed, but only abnormal results are displayed) Labs Reviewed  BASIC METABOLIC PANEL - Abnormal; Notable for the following components:      Result Value   Glucose, Bld 121 (*)    Calcium 8.7 (*)    All other components within normal limits  CBC WITH DIFFERENTIAL/PLATELET - Abnormal; Notable for the following components:   WBC 17.1 (*)    Neutro Abs 12.5 (*)    Monocytes Absolute 1.2 (*)    All  other components within normal limits  PHENYTOIN LEVEL, TOTAL - Abnormal; Notable for the following components:   Phenytoin Lvl <2.5 (*)    All other components within normal limits  MAGNESIUM  POC URINE PREG, ED  POC URINE PREG, ED    EKG None  Radiology No results found.  Procedures Procedures (including critical care time)  Medications Ordered in ED Medications  phenytoin (DILANTIN) 1,750 mg in sodium chloride 0.9 % 250 mL IVPB (0 mg Intravenous Stopped 10/01/17 1051)     Initial Impression / Assessment and Plan / ED Course  I have reviewed the triage vital signs and the nursing notes.  Pertinent labs & imaging results that were available during my care of the patient were reviewed by me and considered in my medical decision making (see chart for details).     Patient's Dilantin level is undetectable.  I talked with tele-neurology, Dr. Rosalva FerronSuredy, who has recommended 300 mg at bedtime for seizure treatment.  We discussed the importance of following up with neurology.  She understands she cannot drive.  Tele-neurologist has evaluated patient.  At this point there are no other concerning findings on work-up to suggest a different cause of her seizures.  Her WBC is elevated, likely degranulation from the seizure and this is also been a recurrent issue.  Return precautions.  Addendum: Of note, patient fell out of bed just prior to discharge.  Patient was reaching for the remote and fell.  She denies any head injury or other injuries.  She did not lose consciousness or have a seizure.  She has no complaints and appears stable for discharge home.  Final Clinical Impressions(s) / ED Diagnoses   Final diagnoses:  Seizure [facility](HCC)    ED Discharge Orders        Ordered    Ambulatory referral to Neurology    Comments:  An appointment is requested in approximately: 2 weeks   10/01/17 0753    phenytoin (DILANTIN) 100 MG ER capsule  Daily at bedtime     10/01/17 1056       Pricilla LovelessGoldston,  Brehanna Deveny, MD 10/01/17 1100    Pricilla LovelessGoldston, Chaseton Yepiz, MD 10/01/17 1103

## 2017-10-01 NOTE — ED Triage Notes (Signed)
Per [facility], pt was brought to ED for seizures. Pt stated to EMS that she had a 2 minute long seizure. Pt has a hx of seizures and is compliant with her medications.   20 G Left forearm.

## 2017-10-01 NOTE — Discharge Instructions (Addendum)
It is very important to take your medicine as prescribed.  If you develop recurrent seizures, severe headache, fever, or other new/concerning symptoms then return to the ER for evaluation.  Do not drink alcohol.  You may not drive until cleared by your neurologist.

## 2017-10-01 NOTE — ED Notes (Signed)
Bed: ZO10WA10 Expected date:  Expected time:  Means of arrival:  Comments: 27 yr old seizures

## 2017-10-01 NOTE — ED Notes (Signed)
Patient was found on floor with boyfriend yelling that she fell trying to get her remote. Patient was last seen hooked up to all monitoring after her last trip to the bathroom along with both side rails up and call bell in reach. It looked as though the call bell fell in between the rails and that the rail was put down so she could obtain it, but couldn't find it. Patient has been ambulatory without assistance for multiple bathroom trips this morning. Vitals were completed and MD was notified.

## 2017-10-08 ENCOUNTER — Emergency Department (HOSPITAL_COMMUNITY)
Admission: EM | Admit: 2017-10-08 | Discharge: 2017-10-08 | Disposition: A | Discharge status: Home / Self Care | Payer: Medicaid Other | Attending: Emergency Medicine | Admitting: Emergency Medicine

## 2017-10-08 ENCOUNTER — Other Ambulatory Visit: Payer: Self-pay

## 2017-10-08 ENCOUNTER — Encounter (HOSPITAL_COMMUNITY): Payer: Self-pay

## 2017-10-08 DIAGNOSIS — F17210 Nicotine dependence, cigarettes, uncomplicated: Secondary | ICD-10-CM | POA: Diagnosis not present

## 2017-10-08 DIAGNOSIS — R42 Dizziness and giddiness: Secondary | ICD-10-CM | POA: Insufficient documentation

## 2017-10-08 DIAGNOSIS — R569 Unspecified convulsions: Secondary | ICD-10-CM

## 2017-10-08 DIAGNOSIS — G40909 Epilepsy, unspecified, not intractable, without status epilepticus: Secondary | ICD-10-CM | POA: Insufficient documentation

## 2017-10-08 DIAGNOSIS — F1210 Cannabis abuse, uncomplicated: Secondary | ICD-10-CM | POA: Insufficient documentation

## 2017-10-08 HISTORY — DX: Unspecified convulsions: R56.9

## 2017-10-08 LAB — BASIC METABOLIC PANEL
Anion gap: 6 (ref 5–15)
BUN: 7 mg/dL (ref 6–20)
CO2: 25 mmol/L (ref 22–32)
Calcium: 8.8 mg/dL — ABNORMAL LOW (ref 8.9–10.3)
Chloride: 108 mmol/L (ref 98–111)
Creatinine, Ser: 0.82 mg/dL (ref 0.44–1.00)
GFR calc Af Amer: >60 mL/min (ref >60)
GFR calc non Af Amer: >60 mL/min (ref >60)
Glucose, Bld: 112 mg/dL — ABNORMAL HIGH (ref 70–99)
Potassium: 3.9 mmol/L (ref 3.5–5.1)
Sodium: 139 mmol/L (ref 135–145)

## 2017-10-08 LAB — CBC WITH DIFFERENTIAL/PLATELET
Basophils Absolute: 0 10*3/uL (ref 0.0–0.1)
Basophils Relative: 0 %
Eosinophils Absolute: 0.1 10*3/uL (ref 0.0–0.7)
Eosinophils Relative: 1 %
HCT: 38 % (ref 36.0–46.0)
Hemoglobin: 12.5 g/dL (ref 12.0–15.0)
Lymphocytes Relative: 15 %
Lymphs Abs: 2.5 10*3/uL (ref 0.7–4.0)
MCH: 28.8 pg (ref 26.0–34.0)
MCHC: 32.9 g/dL (ref 30.0–36.0)
MCV: 87.6 fL (ref 78.0–100.0)
Monocytes Absolute: 1.1 10*3/uL — ABNORMAL HIGH (ref 0.1–1.0)
Monocytes Relative: 6 %
Neutro Abs: 13 10*3/uL — ABNORMAL HIGH (ref 1.7–7.7)
Neutrophils Relative %: 78 %
Platelets: 252 10*3/uL (ref 150–400)
RBC: 4.34 MIL/uL (ref 3.87–5.11)
RDW: 13.4 % (ref 11.5–15.5)
WBC: 16.6 10*3/uL — ABNORMAL HIGH (ref 4.0–10.5)

## 2017-10-08 LAB — I-STAT BETA HCG BLOOD, ED (MC, WL, AP ONLY): I-stat hCG, quantitative: <5 m[IU]/mL (ref <5)

## 2017-10-08 LAB — PHENYTOIN LEVEL, TOTAL: Phenytoin Lvl: 7.3 ug/mL — ABNORMAL LOW (ref 10.0–20.0)

## 2017-10-08 LAB — MAGNESIUM: Magnesium: 2 mg/dL (ref 1.7–2.4)

## 2017-10-08 MED ORDER — PHENYTOIN SODIUM EXTENDED 100 MG PO CAPS: 400.0000 mg | ORAL_CAPSULE | Freq: Every day | ORAL | 0 refills | Status: DC

## 2017-10-08 MED ORDER — PHENYTOIN SODIUM 50 MG/ML IJ SOLN
500.0000 mg | Freq: Once | INTRAMUSCULAR | Status: AC
  Administered 2017-10-08: 500 mg via INTRAVENOUS
  Filled 2017-10-08: qty 10

## 2017-10-08 NOTE — ED Notes (Signed)
Pt updated on plan of care

## 2017-10-08 NOTE — ED Notes (Signed)
Bed: WA16 Expected date:  Expected time:  Means of arrival:  Comments: EMS [facility] 

## 2017-10-08 NOTE — Discharge Instructions (Signed)
Start taking your Dilantin 400 mg at night.  This is 4 tablets.  Follow-up with [facility] neurolog[facility] as scheduled.  Return to the emergency department if any concerning signs or symptoms develop such as fevers, persistent seizures, slurred speech, or passing out.

## 2017-10-08 NOTE — ED Provider Notes (Signed)
[facility]-EMERGENC[facility] DEPT Provider Note   CSN: [id number] Arrival date & time: 10/08/17  1425     History   Chief Complaint Chief Complaint  Patient presents with  . Seizures    HPI Wendy Stephenson is a 27 y.o. female with history of newly diagnosed seizure disorder presents for evaluation of seizure-like activity.  Patient states that at around noon she went to take a nap.  When she awoke she states that she felt lightheaded as though she was going to pass out.  She states that this is sometimes how she feels before her seizures come on.  She told her boyfriend who walked her to the bathroom because she stated that she needed to urinate.  She began to have a seizure while on the toilet.  Patient's boyfriend states that he supported her body while she was seizing, she did not hit her head or pass out.  No urinary incontinence but she did sustain a very mild tongue injury.  He states that she seized for approximately 2 minutes. She was postictal for approximately 30 minutes.  She states she feels back to baseline at this time.  Her first seizure was on 04/27/2017, she has been seen 3 other times for seizures, most recently 7 days ago.  She was on Keppra but was not compliant with this medication due to side effects.  She was started on Dilantin 300 mg nightly on 08/21/2017 and states she has been compliant with this medication.  She was discharged from her [facility]y practice [facility] due to missed appointments.  She tells me that she typically smokes marijuana 3 times daily, smokes a proximally half a pack of cigarettes daily, denies any recent alcohol intake.  At this time she endorses feeling very tired and generalized body aches but denies fever, headache, numbness, weakness, dizziness, vision changes, nausea, vomiting, chest pain, or shortness of breath.  The history is provided by the patient.    Past Medical History:  Diagnosis Date  . Seizures (HCC)      There are no active problems to display for this patient.   History reviewed. No pertinent surgical history.   OB History   None      Home Medications    Prior to Admission medications   Medication Sig Start Date End Date Taking? Authorizing Provider  phenytoin (DILANTIN) 100 MG ER capsule Take 3 capsules (300 mg total) by mouth at bedtime. 10/01/17  Yes Pricilla LovelessGoldston, Scott, MD    Family History History reviewed. No pertinent family history.  Social History Social History   Tobacco Use  . Smoking status: Current Every Day Smoker  . Smokeless tobacco: Never Used  Substance Use Topics  . Alcohol use: No  . Drug use: Yes    Types: Marijuana     Allergies   Keppra [levetiracetam]   Review of Systems Review of Systems  Constitutional: Negative for chills and fever.  Respiratory: Negative for shortness of breath.   Cardiovascular: Negative for chest pain.  Gastrointestinal: Negative for abdominal pain, nausea and vomiting.  Musculoskeletal: Positive for myalgias.  Neurological: Positive for seizures and light-headedness. Negative for weakness and numbness.  All other systems reviewed and are negative.    Physical Exam Updated Vital Signs BP (!) 154/93   Pulse 84   Temp 98.3 F (36.8 C) (Oral)   Resp 18   Wt 106.6 kg (235 lb)   LMP 09/15/2017   SpO2 100%   BMI 44.40 kg/m  Physical Exam  Constitutional: She is oriented to person, place, and time. She appears well-developed and well-nourished. No distress.  HENT:  Head: Normocephalic and atraumatic.  Mild superficial abrasion of the right lateral aspect of the tongue.  Tolerating secretions without difficulty. no active bleeding.  Eyes: Conjunctivae are normal. Right eye exhibits no discharge. Left eye exhibits no discharge.  Neck: Normal range of motion. Neck supple. No JVD present. No tracheal deviation present.  Cardiovascular: Normal rate, regular rhythm and normal heart sounds.  Pulmonary/Chest:  Effort normal and breath sounds normal.  Abdominal: Soft. Bowel sounds are normal. She exhibits no distension. There is no tenderness. There is no guarding.  Musculoskeletal: She exhibits no edema.  Neurological: She is alert and oriented to person, place, and time. No cranial nerve deficit or sensory deficit. She exhibits normal muscle tone.  Mental Status:  Alert, thought content appropriate, able to give a coherent history. Speech fluent without evidence of aphasia. Able to follow 2 step commands without difficulty.  Cranial Nerves:  II:  Peripheral visual fields grossly normal, pupils equal, round, reactive to light III,IV, VI: ptosis not present, extra-ocular motions intact bilaterally  V,VII: smile symmetric, facial light touch sensation equal VIII: hearing grossly normal to voice  X: uvula elevates symmetrically  XI: bilateral shoulder shrug symmetric and strong XII: midline tongue extension without fassiculations Motor:  Normal tone. 5/5 strength of BUE and BLE major muscle groups including strong and equal grip strength and dorsiflexion/plantar flexion Sensory: light touch normal in all extremities. Cerebellar: normal finger-to-nose with bilateral upper extremities Gait: normal gait and balance. Able to walk on toes and heels with ease.     Skin: Skin is warm and dry. No erythema.  Psychiatric: She has a normal mood and affect. Her behavior is normal.  Nursing note and vitals reviewed.    ED Treatments / Results  Labs (all labs ordered are listed, but only abnormal results are displayed) Labs Reviewed  PHENYTOIN LEVEL, TOTAL - Abnormal; Notable for the following components:      Result Value   Phenytoin Lvl 7.3 (*)    All other components within normal limits  CBC WITH DIFFERENTIAL/PLATELET - Abnormal; Notable for the following components:   WBC 16.6 (*)    Neutro Abs 13.0 (*)    Monocytes Absolute 1.1 (*)    All other components within normal limits  BASIC METABOLIC  PANEL - Abnormal; Notable for the following components:   Glucose, Bld 112 (*)    Calcium 8.8 (*)    All other components within normal limits  MAGNESIUM  ALBUMIN  I-STAT BETA HCG BLOOD, ED ([facility], [facility], [facility] ONLY)    EKG None  Radiology No results found.  Procedures Procedures (including critical care time)  Medications Ordered in ED Medications  phenytoin (DILANTIN) 500 mg in sodium chloride 0.9 % 100 mL IVPB (500 mg Intravenous New Bag/Given 10/08/17 1655)     Initial Impression / Assessment and Plan / ED Course  I have reviewed the triage vital signs and the nursing notes.  Pertinent labs & imaging results that were available during my care of the patient were reviewed by me and considered in my medical decision making (see chart for details).     Patient presents for evaluation after seizure.  She is afebrile, vital signs are at her baseline.  She is nontoxic in appearance.  She is not postictal at this time, states she feels back to her baseline aside from feeling tired.  Initially tells  me she smokes marijuana 3x daily, now tells me 7-8x daily. No focal neurologic deficits, ambulatory without difficulty.  Mild superficial tongue abrasion noted on the right.  Tolerating secretions without difficulty.  States she has been compliant with her Dilantin.  Phenytoin level today is still subtherapeutic but detectable as compared to 7 days ago.  Remainder of lab work reviewed by me shows a leukocytosis, likely secondary to decannulation from seizure.  This has been persistent/consistent every time she comes in after a seizure.  Remainder of lab work is unremarkable.  We will give a dose of 500mg  Dilantin IV.  Will increase Dilantin to 400 mg nightly. 4:34 PM Spoke with Gearldine Bienenstock, receptionist at [facility]; patient now has an appointment on 7/18 at 9:30 AM with Dr. Karel Jarvis.   On reevaluation the patient is sitting comfortably no apparent distress.  She states she feels comfortable with  discharge home.  We will increase her Dilantin as above.  She understands to follow-up with our neurology as scheduled.  Discussed strict ED return precautions.  Encourage the patient to quit smoking marijuana.  Patient and patient's significant other verbalized understanding of and agreement with plan and patient stable for discharge home at this time.  Final Clinical Impressions(s) / ED Diagnoses   Final diagnoses:  Seizure disorder [facility])  Seizure-like activity [facility])    ED Discharge Orders    None       Bennye Alm 10/08/17 1731    Bethann Berkshire, MD 10/09/17 [phone]

## 2017-10-08 NOTE — ED Notes (Addendum)
Pt keeps removing monitoring equipment (BP cuff). Pt asked to keep BP cuff on, pt states that it aggravates her.

## 2017-10-08 NOTE — ED Triage Notes (Signed)
Pt arrives via EMS from home for seizure like activity. Per EMS: Pt is newly dx with seizure and is compliant with medications. EMS reports that pt was post ictal for 25 mins p[t is now back to baseline.

## 2017-10-29 ENCOUNTER — Ambulatory Visit: Payer: Medicaid Other | Admitting: Neurology

## 2017-10-29 ENCOUNTER — Encounter: Payer: Self-pay | Admitting: Neurology

## 2017-10-29 ENCOUNTER — Other Ambulatory Visit: Payer: Self-pay

## 2017-10-29 ENCOUNTER — Encounter

## 2017-10-29 VITALS — BP 142/88 | HR 93 | Ht 62.0 in | Wt 231.0 lb

## 2017-10-29 DIAGNOSIS — G40009 Localization-related (focal) (partial) idiopathic epilepsy and epileptic syndromes with seizures of localized onset, not intractable, without status epilepticus: Secondary | ICD-10-CM | POA: Diagnosis not present

## 2017-10-29 MED ORDER — PHENYTOIN SODIUM EXTENDED 100 MG PO CAPS: 400.0000 mg | ORAL_CAPSULE | Freq: Every day | ORAL | 11 refills | Status: DC

## 2017-10-29 NOTE — Patient Instructions (Addendum)
1. Schedule 1-hour sleep-deprived EEG 2. Continue Dilantin 400mg  every night 3. Follow-up in 3-4 months, call for any changes  Seizure Precautions: 1. If medication has been prescribed for you to prevent seizures, take it exactly as directed.  Do not stop taking the medicine without talking to your doctor first, even if you have not had a seizure in a long time.   2. Avoid activities in which a seizure would cause danger to yourself or to others.  Don't operate dangerous machinery, swim alone, or climb in high or dangerous places, such as on ladders, roofs, or girders.  Do not drive unless your doctor says you may.  3. If you have any warning that you may have a seizure, lay down in a safe place where you can't hurt yourself.    4.  No driving for 6 months from last seizure, as per [place] law.   Please refer to the following link on the [employer]'s website for more information: http://www.epilepsyfoundation.org/answerplace/Social/driving/drivingu.cfm   5.  Maintain good sleep hygiene. Avoid alcohol.  6.  Notify your neurology if you are planning pregnancy or if you become pregnant.  7.  Contact your doctor if you have any problems that may be related to the medicine you are taking.  8.  Call 911 and bring the patient back to the ED if:        A.  The seizure lasts longer than 5 minutes.       B.  The patient doesn't awaken shortly after the seizure  C.  The patient has new problems such as difficulty seeing, speaking or moving  D.  The patient was injured during the seizure  E.  The patient has a temperature over 102 F (39C)  F.  The patient vomited and now is having trouble breathing

## 2017-10-29 NOTE — Progress Notes (Signed)
NEUROLOGY CONSULTATION NOTE  Wendy Stephenson MRN: [id number] DOB: 10/16/1990  Referring provider: Dr. Pricilla Loveless (ER) Primary care provider: none listed  Reason for consult:  seizures  Dear Dr Estell Harpin:  Thank you for your kind referral of Wendy Stephenson for consultation of the above symptoms. Although her history is well known to you, please allow me to reiterate it for the purpose of our medical record. The patient was accompanied to the clinic by her boyfriend who also provides collateral information. Records and images were personally reviewed where available.  HISTORY OF PRESENT ILLNESS: This is a pleasant 27 year old right-handed womanoman with no significant past medical history, in her usual state of health until 04/27/17 when she recalls feeling warm, she went to turn on the fan and then to the bathroom, then her boyfriend heard a fall and saw her having a full body convulsion lasting 2 minutes. She was confused after, no focal weakness. She bit her tongue, no incontinence. Bloodwork showed an elevated WBC of 19.1, normal BMP. I personally reviewed CT head without contrast which was normal. She had another episode while sleeping on 07/09/17. She woke up feeling odd, dizzy like she would pass out. She sat up then started having a convulsion lasting 2 minutes. She was again brought to the ER, WBC was 20, BMP normal. I personally reviewed MRI brain with and without contrast which was normal, hippocampi symmetric with no abnormal signal or enhancement seen. She was discharged home on Keppra 500mg  BID but had itching that was not relieved with Benadryl, so she stopped the medication. She was back in the ER on 08/21/17 with another convulsion and was discharged home on Dilantin. She was back on 10/01/17 with another seizure out of sleep, she woke up feeling hot, tried to go back to sleep, then woke up in the ambulance. Her boyfriend again noted a convulsive seizure. Dilantin level was undetectable. Her  last seizure was on 10/01/17, she took a nap at noon and woke up feeling lightheaded like she would pass out, her boyfriend assisted her to the commode and she had a convulsion with tongue bite. Seizure lasted 2 minutes. Her Dilantin level was 7.3. She was discharged home on Dilantin 400mg  qhs. Her boyfriend reports some seizures are associated with head turn to the left side. She has occasional episodes where she feels she will have a seizure with the dizziness occurring 1-2 times a month, but it does not progress. Last episode was 2 days ago. Her boyfriend feels she would stare off after taking the Dilantin, it takes her a while to respond. She denies any gaps in time, olfactory/gustatory hallucinations, deja vu, rising epigastric sensation, focal numbness/tingling/weakness. She has occasional body jerks. She feels weird on the Dilantin, but denies any dizziness, diplopia, gait instability. She denies any headaches, neck/back pain, bowel/bladder dysfunction.   Epilepsy Risk Factors:  Her paternal uncle had epilepsy. Otherwise she had a normal birth and early development.  There is no history of febrile convulsions, CNS infections such as meningitis/encephalitis, significant traumatic brain injury, neurosurgical procedures.  Prior AEDs: Keppra (itching)  PAST MEDICAL HISTORY: Past Medical History:  Diagnosis Date  . Seizures (HCC)     PAST SURGICAL HISTORY: History reviewed. No pertinent surgical history.  MEDICATIONS: Current Outpatient Medications on File Prior to Visit  Medication Sig Dispense Refill  . phenytoin (DILANTIN) 100 MG ER capsule Take 4 capsules (400 mg total) by mouth at bedtime. 60 capsule 0   No current  facility-administered medications on file prior to visit.     ALLERGIES: Allergies  Allergen Reactions  . Keppra [Levetiracetam] Itching    Mostly feet, hands too a little bit    FAMILY HISTORY: No family history on file.  SOCIAL HISTORY: Social History    Socioeconomic History  . Marital status: Single    Spouse name: Not on file  . Number of children: Not on file  . Years of education: Not on file  . Highest education level: Not on file  Occupational History  . Not on file  Social Needs  . Financial resource strain: Not on file  . Food insecurity:    Worry: Not on file    Inability: Not on file  . Transportation needs:    Medical: Not on file    Non-medical: Not on file  Tobacco Use  . Smoking status: Current Every Day Smoker  . Smokeless tobacco: Never Used  Substance and Sexual Activity  . Alcohol use: No  . Drug use: Yes    Types: Marijuana  . Sexual activity: Not on file  Lifestyle  . Physical activity:    Days per week: Not on file    Minutes per session: Not on file  . Stress: Not on file  Relationships  . Social connections:    Talks on phone: Not on file    Gets together: Not on file    Attends religious service: Not on file    Active member of club or organization: Not on file    Attends meetings of clubs or organizations: Not on file    Relationship status: Not on file  . Intimate partner violence:    Fear of current or ex partner: Not on file    Emotionally abused: Not on file    Physically abused: Not on file    Forced sexual activity: Not on file  Other Topics Concern  . 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    REVIEW OF SYSTEMS: Constitutional: No fevers, chills, or sweats, no generalized fatigue, change in appetite Eyes: No visual changes, double vision, eye pain Ear, nose and throat: No hearing loss, ear pain, nasal congestion, sore throat Cardiovascular: No chest pain, palpitations Respiratory:  No shortness of breath at rest or with exertion, wheezes GastrointestinaI: No nausea, vomiting, diarrhea, abdominal  pain, fecal incontinence Genitourinary:  No dysuria, urinary retention or frequency Musculoskeletal:  No neck pain, back pain Integumentary: No rash, pruritus, skin lesions Neurological: as above Psychiatric: No depression, insomnia, anxiety Endocrine: No palpitations, fatigue, diaphoresis, mood swings, change in appetite, change in weight, increased thirst Hematologic/Lymphatic:  No anemia, purpura, petechiae. Allergic/Immunologic: no itchy/runny eyes, nasal congestion, recent allergic reactions, rashes  PHYSICAL EXAM: Vitals:   10/29/17 0925  BP: (!) 142/88  Pulse: 93  SpO2: 100%   General: No acute distress Head:  Normocephalic/atraumatic Eyes: Fundoscopic exam shows bilateral sharp discs, no vessel changes, exudates, or hemorrhages Neck: supple, no paraspinal tenderness, full range of motion Back: No paraspinal tenderness Heart: regular rate and rhythm Lungs: Clear to auscultation bilaterally. Vascular: No carotid bruits. Skin/Extremities: No rash, no edema Neurological Exam: Mental status: alert and oriented to person, place, and time, no dysarthria or aphasia, Fund of knowledge is appropriate.  Remote memory intact. 0/3 delayed recall.  Attention and concentration are normal.    Able to name objects and repeat phrases. Cranial nerves: CN I: not tested CN II: pupils equal, round and reactive to light, visual fields intact, fundi unremarkable. CN III, IV, VI:  full range of motion, no nystagmus, no ptosis CN V: facial sensation intact CN VII: upper and lower face symmetric CN VIII: hearing intact to finger rub CN IX, X: gag intact, uvula midline CN XI: sternocleidomastoid and trapezius muscles intact CN XII: tongue midline Bulk & Tone: normal, no fasciculations. Motor: 5/5 throughout with no pronator drift. Sensation: intact to light touch, cold, pin, vibration and joint position sense.  No extinction to double simultaneous stimulation.  Romberg test negative Deep Tendon  Reflexes: brisk +2 throughout, negative Hoffman sign, no ankle clonus Plantar responses: downgoing bilaterally Cerebellar: no incoordination on finger to nose, heel to shin. No dysdiadochokinesia Gait: narrow-based and steady, able to tandem walk adequately. 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 She describes a stereotyped sensation of dizziness prior to the convulsions, may have left head turn with some of them. MRI brain normal, a 1-hour sleep-deprived EEG will be ordered to further classify her seizures. She is overall tolerating Dilantin 400mg  daily without side effects, we have agreed to continue on current dose for now. She has no pregnancy plans this year, we discussed potentially switching to lamotrigine in the future. [place] driving laws were discussed with the patient, and she knows to stop driving after a seizure, until 6 months seizure-free. She will follow-up in 3-4 months and knows to call for any changes.   Thank you for allowing me to participate in the care of this patient. Please do not hesitate to call for any questions or concerns.   Patrcia Dolly, M.D.  CC: Dr. Criss Alvine

## 2017-11-04 ENCOUNTER — Ambulatory Visit (INDEPENDENT_AMBULATORY_CARE_PROVIDER_SITE_OTHER): Payer: Medicaid Other | Admitting: Neurology

## 2017-11-04 DIAGNOSIS — G40009 Localization-related (focal) (partial) idiopathic epilepsy and epileptic syndromes with seizures of localized onset, not intractable, without status epilepticus: Secondary | ICD-10-CM

## 2017-11-05 ENCOUNTER — Encounter (HOSPITAL_COMMUNITY): Payer: Self-pay | Admitting: *Deleted

## 2017-11-05 ENCOUNTER — Other Ambulatory Visit: Payer: Self-pay

## 2017-11-05 ENCOUNTER — Telehealth: Payer: Self-pay | Admitting: Neurology

## 2017-11-05 ENCOUNTER — Emergency Department (HOSPITAL_COMMUNITY)
Admission: EM | Admit: 2017-11-05 | Discharge: 2017-11-05 | Disposition: A | Discharge status: Home / Self Care | Payer: Medicaid Other | Attending: Emergency Medicine | Admitting: Emergency Medicine

## 2017-11-05 DIAGNOSIS — F17200 Nicotine dependence, unspecified, uncomplicated: Secondary | ICD-10-CM | POA: Diagnosis not present

## 2017-11-05 DIAGNOSIS — G40909 Epilepsy, unspecified, not intractable, without status epilepticus: Secondary | ICD-10-CM | POA: Diagnosis not present

## 2017-11-05 DIAGNOSIS — R569 Unspecified convulsions: Secondary | ICD-10-CM | POA: Diagnosis present

## 2017-11-05 DIAGNOSIS — R3121 Asymptomatic microscopic hematuria: Secondary | ICD-10-CM | POA: Insufficient documentation

## 2017-11-05 LAB — I-STAT CHEM 8, ED
BUN: 6 mg/dL (ref 6–20)
CALCIUM ION: 1.17 mmol/L (ref 1.15–1.40)
CHLORIDE: 106 mmol/L (ref 98–111)
CREATININE: 0.7 mg/dL (ref 0.44–1.00)
Glucose, Bld: 108 mg/dL — ABNORMAL HIGH (ref 70–99)
HCT: 40 % (ref 36.0–46.0)
Hemoglobin: 13.6 g/dL (ref 12.0–15.0)
Potassium: 3.7 mmol/L (ref 3.5–5.1)
SODIUM: 139 mmol/L (ref 135–145)
TCO2: 22 mmol/L (ref 22–32)

## 2017-11-05 LAB — CBC WITH DIFFERENTIAL/PLATELET
ABS IMMATURE GRANULOCYTES: 0.1 10*3/uL (ref 0.0–0.1)
BASOS ABS: 0.1 10*3/uL (ref 0.0–0.1)
Basophils Relative: 0 %
Eosinophils Absolute: 0.2 10*3/uL (ref 0.0–0.7)
Eosinophils Relative: 2 %
HEMATOCRIT: 39.6 % (ref 36.0–46.0)
HEMOGLOBIN: 12.8 g/dL (ref 12.0–15.0)
Immature Granulocytes: 0 %
LYMPHS PCT: 25 %
Lymphs Abs: 3.4 10*3/uL (ref 0.7–4.0)
MCH: 28.4 pg (ref 26.0–34.0)
MCHC: 32.3 g/dL (ref 30.0–36.0)
MCV: 88 fL (ref 78.0–100.0)
MONO ABS: 1.3 10*3/uL — AB (ref 0.1–1.0)
MONOS PCT: 10 %
Neutro Abs: 8.9 10*3/uL — ABNORMAL HIGH (ref 1.7–7.7)
Neutrophils Relative %: 63 %
Platelets: 241 10*3/uL (ref 150–400)
RBC: 4.5 MIL/uL (ref 3.87–5.11)
RDW: 13.4 % (ref 11.5–15.5)
WBC: 14 10*3/uL — AB (ref 4.0–10.5)

## 2017-11-05 LAB — PHENYTOIN LEVEL, TOTAL: PHENYTOIN LVL: 8.6 ug/mL — AB (ref 10.0–20.0)

## 2017-11-05 LAB — I-STAT BETA HCG BLOOD, ED (MC, WL, AP ONLY): I-stat hCG, quantitative: <5 m[IU]/mL (ref <5)

## 2017-11-05 MED ORDER — SODIUM CHLORIDE 0.9 % IV SOLN
1500.0000 mg | Freq: Once | INTRAVENOUS | Status: AC
  Administered 2017-11-05: 1500 mg via INTRAVENOUS
  Filled 2017-11-05 (×2): qty 30

## 2017-11-05 NOTE — Telephone Encounter (Signed)
Patient wants to talk to someone about her having a seizure this morning and wants to know what to do now. She went to the [facility] at [facility] this morning

## 2017-11-05 NOTE — Telephone Encounter (Signed)
Patient called back to see what needs to be done at this point. Thanks

## 2017-11-05 NOTE — ED Triage Notes (Signed)
Pt's boyfriend woke up to pt having a full body convulsion. Pt has hx of seizures, recently seen her neurologist and has been taking dilantin. Per EMS pt was postictal on there arrival, currently A&Ox4. Pt reports tonight she wasn't feeling well, so she took 2 of her dilantin pills instead of the prescribed 4. Pt has bit mark to tongue, no incontinence

## 2017-11-05 NOTE — Discharge Instructions (Addendum)
No driving for 6 months. 

## 2017-11-05 NOTE — ED Provider Notes (Addendum)
[facility] EMERGENCY DEPARTMENT Provider Note   CSN: [id number] Arrival date & time: 11/05/17  0345     History   Chief Complaint Chief Complaint  Patient presents with  . Seizures    HPI Wendy Stephenson is a 27 y.o. female.  The history is provided by the EMS personnel. The history is limited by the condition of the patient.  Seizures   This is a recurrent problem. The current episode started less than 1 hour ago. The problem has been resolved. There was 1 seizure. The most recent episode lasted 30 to 120 seconds. Associated symptoms include sleepiness. Pertinent negatives include no headaches, no speech difficulty and no chest pain. Characteristics include rhythmic jerking. The episode was witnessed. There was no sensation of an aura present. The seizures did not continue in the ED. The seizure(s) had no focality. Possible causes do not include recent illness. There has been no fever. There were no medications administered prior to arrival.  Patient with known seizure d/o who had witnessed seizure in her sleep.  No f/c/r.  Has been compliant per her report.     Past Medical History:  Diagnosis Date  . Seizures [facility])     Patient Active Problem List   Diagnosis Date Noted  . Localization-related idiopathic epilepsy and epileptic syndromes with seizures of localized onset, not intractable, without status epilepticus (HCC) 10/29/2017    History reviewed. No pertinent surgical history.   OB History   None      Home Medications    Prior to Admission medications   Medication Sig Start Date End Date Taking? Authorizing Provider  phenytoin (DILANTIN) 100 MG ER capsule Take 4 capsules (400 mg total) by mouth at bedtime. 10/29/17   Van Clines, MD    Family History No family history on file.  Social History Social History   Tobacco Use  . Smoking status: Current Every Day Smoker  . Smokeless tobacco: Never Used  Substance Use Topics  . Alcohol  use: No  . Drug use: Yes    Types: Marijuana     Allergies   Keppra [levetiracetam]   Review of Systems Review of Systems  Constitutional: Negative for diaphoresis and fever.  HENT: Negative for trouble swallowing.   Eyes: Negative for photophobia.  Respiratory: Negative for chest tightness and shortness of breath.   Cardiovascular: Negative for chest pain.  Genitourinary: Negative for flank pain.  Neurological: Positive for seizures. Negative for dizziness, facial asymmetry, speech difficulty, weakness, light-headedness, numbness and headaches.  Psychiatric/Behavioral: The patient is not nervous/anxious.   All other systems reviewed and are negative.    Physical Exam Updated Vital Signs BP 110/71   Pulse 88   Temp 98.2 F (36.8 C)   Resp 18   SpO2 100%   Physical Exam  Constitutional: She is oriented to person, place, and time. She appears well-developed and well-nourished. No distress.  HENT:  Head: Normocephalic and atraumatic.  Mouth/Throat: No oropharyngeal exudate.  Eyes: Pupils are equal, round, and reactive to light. Conjunctivae are normal.  Neck: Normal range of motion. Neck supple.  Cardiovascular: Normal rate, regular rhythm, normal heart sounds and intact distal pulses.  Pulmonary/Chest: Effort normal and breath sounds normal. No stridor. She has no wheezes. She has no rales.  Abdominal: Soft. Bowel sounds are normal. She exhibits no mass. There is no tenderness. There is no rebound and no guarding.  Musculoskeletal: Normal range of motion.  Neurological: She is alert and oriented to person,  place, and time.  Skin: Skin is warm and dry. Capillary refill takes less than 2 seconds.  Psychiatric: She has a normal mood and affect.  Nursing note and vitals reviewed.    ED Treatments / Results  Labs (all labs ordered are listed, but only abnormal results are displayed) Results for orders placed or performed during the hospital encounter of 11/05/17  CBC  with Differential/Platelet  Result Value Ref Range   WBC 14.0 (H) 4.0 - 10.5 K/uL   RBC 4.50 3.87 - 5.11 MIL/uL   Hemoglobin 12.8 12.0 - 15.0 g/dL   HCT 13.2 44.0 - 10.2 %   MCV 88.0 78.0 - 100.0 fL   MCH 28.4 26.0 - 34.0 pg   MCHC 32.3 30.0 - 36.0 g/dL   RDW 72.5 36.6 - 44.0 %   Platelets 241 150 - 400 K/uL   Neutrophils Relative % 63 %   Neutro Abs 8.9 (H) 1.7 - 7.7 K/uL   Lymphocytes Relative 25 %   Lymphs Abs 3.4 0.7 - 4.0 K/uL   Monocytes Relative 10 %   Monocytes Absolute 1.3 (H) 0.1 - 1.0 K/uL   Eosinophils Relative 2 %   Eosinophils Absolute 0.2 0.0 - 0.7 K/uL   Basophils Relative 0 %   Basophils Absolute 0.1 0.0 - 0.1 K/uL   Immature Granulocytes 0 %   Abs Immature Granulocytes 0.1 0.0 - 0.1 K/uL  Phenytoin level, total  Result Value Ref Range   Phenytoin Lvl 8.6 (L) 10.0 - 20.0 ug/mL  I-Stat Chem 8, ED  Result Value Ref Range   Sodium 139 135 - 145 mmol/L   Potassium 3.7 3.5 - 5.1 mmol/L   Chloride 106 98 - 111 mmol/L   BUN 6 6 - 20 mg/dL   Creatinine, Ser 3.47 0.44 - 1.00 mg/dL   Glucose, Bld 425 (H) 70 - 99 mg/dL   Calcium, Ion 9.56 3.87 - 1.40 mmol/L   TCO2 22 22 - 32 mmol/L   Hemoglobin 13.6 12.0 - 15.0 g/dL   HCT 56.4 33.2 - 95.1 %  I-Stat Beta hCG blood, ED ([facility], [facility], [facility] only)  Result Value Ref Range   I-stat hCG, quantitative <5.0 <5 mIU/mL   Comment 3           No results found.  EKG None  Radiology No results found.  Procedures Procedures (including critical care time)  Medications Ordered in ED Medications  fosPHENYtoin (CEREBYX) 1,500 mg PE in sodium chloride 0.9 % 50 mL IVPB (has no administration in time range)      Final Clinical Impressions(s) / ED Diagnoses  No driving for 6 months this was communicated to you verbally and was written on your discharge paperwork.  Follow up with neurology for recheck this week  Return for pain, numbness, changes in vision or speech, fevers >100.4 unrelieved by medication, shortness of breath,  intractable vomiting, or diarrhea, abdominal pain, Inability to tolerate liquids or food, cough, altered mental status or any concerns. No signs of systemic illness or infection. The patient is nontoxic-appearing on exam and vital signs are within normal limits. Will refer to urology for microscopy hematuria as patient is asymptomatic.  I have reviewed the triage vital signs and the nursing notes. Pertinent labs &imaging results that were available during my care of the patient were reviewed by me and considered in my medical decision making (see chart for details).  After history, exam, and medical workup I feel the patient has been appropriately medically screened and  is safe for discharge home. Pertinent diagnoses were discussed with the patient. Patient was given return precautions.   Kameryn Tisdel, MD 11/05/17 Rochele [id number]    Zuleyka Kloc, MD 11/05/17 [phone]

## 2017-11-06 ENCOUNTER — Encounter (INDEPENDENT_AMBULATORY_CARE_PROVIDER_SITE_OTHER): Payer: Self-pay

## 2017-11-06 MED ORDER — ZONISAMIDE 100 MG PO CAPS: ORAL_CAPSULE | ORAL | 11 refills | Status: DC

## 2017-11-06 NOTE — Telephone Encounter (Addendum)
Spoke to patient, she had another nocturnal seizure yesterday. EEG discussed, no epileptiform discharges, there was rare right temporal slowing. Dilantin level was low, she reports compliance taking 2 caps BID. Discussed adding on Zonisamide 100mg , take 1 cap qhs x 2 weeks, then increase to 2 caps qhs x 2 weeks, then increase to 3 caps qhs and continue. Side effects discussed. Continue taking Dilantin 200mg  BID for now with the ZNS, we may taper off in the future. She was instructed to call our office for an update in 6 weeks. F/u as scheduled on Oct.

## 2017-11-06 NOTE — Telephone Encounter (Signed)
She says she did not receive copy of AVS for instructions for getting on to MyChart, can you pls mail her either AVS or instructions for getting on to her MyChart. Thanks

## 2017-11-06 NOTE — Telephone Encounter (Signed)
I have mailed mychart sign up form to patient.

## 2017-11-06 NOTE — Telephone Encounter (Signed)
Left VM

## 2017-11-06 NOTE — Procedures (Signed)
ELECTROENCEPHALOGRAM REPORT  Date of Study: 11/04/2017  Patient's Name: Wendy HoltsUrsula S Plucinski MRN: [id number] Date of Birth: [birth year]  Referring Provider: Dr. Patrcia DollyKaren Doniven Vanpatten  Clinical History: This is a [age] year old woman with new onset seizures, EEG for classification  Medications: Dilantin  Technical Summary: A multichannel digital 1-hour sleep-deprived EEG recording measured by the international 10-20 system with electrodes applied with paste and impedances below 5000 ohms performed in our laboratory with EKG monitoring in an awake and asleep patient.  Hyperventilation and photic stimulation were performed.  The digital EEG was referentially recorded, reformatted, and digitally filtered in a variety of bipolar and referential montages for optimal display.    Description: The patient is awake and asleep during the recording.  During maximal wakefulness, there is a symmetric, medium voltage 10 Hz posterior dominant rhythm that attenuates with eye opening.  During drowsiness and sleep, there is an increase in theta slowing of the background.  Vertex waves and symmetric sleep spindles were seen. There is rare focal 4-5 Hz theta slowing seen over the right temporal region, at times sharply contoured without clear epileptogenic potential. Hyperventilation and photic stimulation did not elicit any abnormalities.  There were no epileptiform discharges or electrographic seizures seen.    EKG lead was unremarkable.  Impression: This 1-hour awake and asleep EEG is abnormal due to rare focal slowing over the right temporal region in sleep.  Clinical Correlation of the above findings indicates focal cerebral dysfunction over the right temporal region suggestive of underlying structural or physiologic abnormality. The absence of epileptiform discharges does not exclude a clinical diagnosis of epilepsy. Clinical correlation is advised.   Patrcia DollyKaren Bao Coreas, M.D.

## 2017-11-11 ENCOUNTER — Telehealth: Payer: Self-pay | Admitting: Neurology

## 2017-11-11 NOTE — Telephone Encounter (Signed)
Patient agreed to try benadryl.

## 2017-11-11 NOTE — Telephone Encounter (Signed)
Is there is a rash? Typically drug reactions are on the chest/abdomen. Can she try taking a little Benadryl and see how she does first? Unless she is unable to tolerate or there is a rash, would try to continue. Thanks

## 2017-11-11 NOTE — Telephone Encounter (Signed)
Patient called regarding a medication that she feels she might be having an allergic reaction to. She said her feet are Itching. Please Call. Thanks

## 2017-11-11 NOTE — Telephone Encounter (Signed)
Zonisamide added on 11/05/17 and now having itching. Please advise.

## 2017-11-25 ENCOUNTER — Telehealth: Payer: Self-pay | Admitting: Neurology

## 2017-11-25 MED ORDER — ESLICARBAZEPINE ACETATE 400 MG PO TABS: ORAL_TABLET | ORAL | 0 refills | Status: DC

## 2017-11-25 NOTE — Telephone Encounter (Signed)
Patient had a seizure last night. The medication she is on is causing her feet to itch and she is having to take benadryl  To help with the itching..  She is on dilantin and zonegran

## 2017-11-25 NOTE — Telephone Encounter (Signed)
Looks like pt had called about the itching 2 weeks ago and started Benadryl.  pls advise

## 2017-11-25 NOTE — Telephone Encounter (Signed)
Samples placed at front desk for pt to pick up at her convenience.

## 2017-11-25 NOTE — Telephone Encounter (Signed)
Spoke with pt relaying message below.  She is agreeable to trying Aptiom and will come by the office tomorrow to pick up samples.

## 2017-11-25 NOTE — Telephone Encounter (Signed)
Is she on the 2 capsules right now? Have her reduce to 1 capsule for a week, then stop. Would start a different seizure medication, can she come in for samples of Aptiom 400mg  daily, let us know how she is feeling on the medication after 2 weeks. Side effects can be dizziness but this usually goes away once body gets more used to medication. Thanks

## 2017-12-07 ENCOUNTER — Telehealth: Payer: Self-pay | Admitting: Neurology

## 2017-12-07 ENCOUNTER — Other Ambulatory Visit: Payer: Self-pay | Admitting: Neurology

## 2017-12-07 MED ORDER — ESLICARBAZEPINE ACETATE 800 MG PO TABS: ORAL_TABLET | ORAL | 11 refills | Status: DC

## 2017-12-07 NOTE — Telephone Encounter (Signed)
LMOM relaying message below.  

## 2017-12-07 NOTE — Telephone Encounter (Signed)
Pls let her know I'm glad she is tolerating Aptiom 400mg  better. I usually increase dose to 800mg , pls have her continue the sample until done, then when she picks up Rx it will be for Aptiom 800mg  daily. I sent in the Rx. Thanks

## 2017-12-07 NOTE — Telephone Encounter (Signed)
Patient called to let you know that the medication Aptiom is working for her so far. She has 6 pills left from the sample given. Please Call. Thank you

## 2017-12-07 NOTE — Telephone Encounter (Signed)
Ok to send Rx 

## 2017-12-08 ENCOUNTER — Ambulatory Visit: Payer: Self-pay | Admitting: Neurology

## 2018-01-18 ENCOUNTER — Telehealth: Payer: Self-pay | Admitting: Neurology

## 2018-01-18 NOTE — Telephone Encounter (Signed)
Ok for f/u this Thurs, thanks

## 2018-01-18 NOTE — Telephone Encounter (Signed)
error 

## 2018-01-18 NOTE — Telephone Encounter (Signed)
Debby Bud called back.  He is asking if pt can be seen sooner.  States that he is worried about pt having seizure while he is not home.  He handed phone to pt, she gave me verbal permission to speak with Debby Bud - his phone number is [phone]

## 2018-01-18 NOTE — Telephone Encounter (Signed)
Received call from pt's boyfriend, Debby Bud.  He states that pt had another seizure today while they were grocery shopping.  He called the EMS - pt was back at baseline upon arrival, pt refused to go to hospital.  Debby Bud states that pt's medications were recently increased (Aptiom).  I let him know that I would send message to Dr. Karel Jarvis and return call with any recommendations.  I advised that pt did not have anyone on her PDR, and therefor when I call back I will only be allowed to speak with pt.  Debby Bud asked if I would hold for a minutes and call was disconnected.

## 2018-01-18 NOTE — Telephone Encounter (Signed)
Spoke with Debby Bud.  Pt scheduled for Thursday, 10/10 @ 9:30AM

## 2018-01-21 ENCOUNTER — Telehealth: Payer: Self-pay | Admitting: Neurology

## 2018-01-21 ENCOUNTER — Other Ambulatory Visit: Payer: Self-pay

## 2018-01-21 ENCOUNTER — Ambulatory Visit: Payer: Self-pay | Admitting: Neurology

## 2018-01-21 MED ORDER — ESLICARBAZEPINE ACETATE 600 MG PO TABS: ORAL_TABLET | ORAL | 11 refills | Status: DC

## 2018-01-21 NOTE — Telephone Encounter (Signed)
Appointment that was scheduled for today was for medication management.  Please advise.

## 2018-01-21 NOTE — Telephone Encounter (Signed)
Let's increase Aptiom to 1200mg  daily, that would mean she has to take 600mg  2 tablets daily, please let them know to increase and send new Rx, thanks

## 2018-01-21 NOTE — Telephone Encounter (Signed)
Patient's boyfriend called regarding Wendy Stephenson having a Seizure earlier and biting her tongue. They are having EMS come out to check her. He said that she keeps having Seizures. He was asking could her medication be adjusted (Aption 800 MG). She uses [facility] on [address]. Please Call. She had to reschedule today's appt due to transportation. Thanks

## 2018-01-21 NOTE — Telephone Encounter (Signed)
Spoke with pt and boyfriend relaying message below.  Verified preferred pharmacy.  Rx  Aptiom 600mg  #60 with 11 refills Sig = Take 2 tabs BID  Sent to [facility] on [address]

## 2018-01-22 ENCOUNTER — Telehealth: Payer: Self-pay

## 2018-01-22 NOTE — Telephone Encounter (Signed)
Pt called and states that her pharmacy will not have Aptiom 600mg  tablets until Monday.  I asked how many of the 400mg  Tabs pt had on hand.  Pt states that she had "plenty".  Advised pt to take (3) 400mg  Tablets daily for a total of 1200mg  daily until Rx can be filled.  Then once she picks up new Rx, take (2) 600mg  tablets daily.  Pt verbalized understanding.   Dr. Karel Jarvis - [place]

## 2018-01-22 NOTE — Telephone Encounter (Signed)
Noted, thanks!

## 2018-02-03 ENCOUNTER — Ambulatory Visit: Payer: Medicaid Other | Admitting: Neurology

## 2018-02-03 ENCOUNTER — Other Ambulatory Visit: Payer: Self-pay

## 2018-02-03 ENCOUNTER — Encounter

## 2018-02-03 ENCOUNTER — Encounter: Payer: Self-pay | Admitting: Neurology

## 2018-02-03 VITALS — BP 144/82 | HR 81 | Ht 62.0 in | Wt 229.0 lb

## 2018-02-03 DIAGNOSIS — G40009 Localization-related (focal) (partial) idiopathic epilepsy and epileptic syndromes with seizures of localized onset, not intractable, without status epilepticus: Secondary | ICD-10-CM | POA: Diagnosis not present

## 2018-02-03 NOTE — Progress Notes (Signed)
NEUROLOGY FOLLOW UP OFFICE NOTE  [name] [id number] 07/04/1990  HISTORY OF PRESENT ILLNESS: I had the pleasure of seeing Wendy Stephenson in follow-up in the neurology clinic on 02/03/2018.  The patient was last seen 3 months ago for recurrent nocturnal seizures. She is again accompanied by her boyfriend Wendy Stephenson who helps supplement the history today.  Records and images were personally reviewed where available.  Her 1-hour wake and sleep EEG was abnormal due to rare focal slowing over the right temporal region, no epileptiform discharges seen. She reports 3 seizures since her last visit. She called our office to report another seizure on Dilantin 400mg  dose, level was low while reporting compliance, and Zonisamide was added. She had itching on Zonisamide and was switched to Aptiom in August 2019, which she tolerated better. Wendy Stephenson called our office to report a daytime seizure in the grocery last 01/18/18 with associated tongue bite. Aptiom dose increased to 1200mg  daily. No further seizures since then, she has had one aura since dose increase. She denies any headaches, dizziness, vision changes, focal numbness/tingling/weakness.   History on Initial Assessment 10/29/2017: This is a pleasant 27 year old right-handed woman with no significant past medical history, in her usual state of health until 04/27/17 when she recalls feeling warm, she went to turn on the fan and then to the bathroom, then her boyfriend heard a fall and saw her having a full body convulsion lasting 2 minutes. She was confused after, no focal weakness. She bit her tongue, no incontinence. Bloodwork showed an elevated WBC of 19.1, normal BMP. I personally reviewed CT head without contrast which was normal. She had another episode while sleeping on 07/09/17. She woke up feeling odd, dizzy like she would pass out. She sat up then started having a convulsion lasting 2 minutes. She was again brought to the ER, WBC was 20, BMP normal. I  personally reviewed MRI brain with and without contrast which was normal, hippocampi symmetric with no abnormal signal or enhancement seen. She was discharged home on Keppra 500mg  BID but had itching that was not relieved with Benadryl, so she stopped the medication. She was back in the ER on 08/21/17 with another convulsion and was discharged home on Dilantin. She was back on 10/01/17 with another seizure out of sleep, she woke up feeling hot, tried to go back to sleep, then woke up in the ambulance. Her boyfriend again noted a convulsive seizure. Dilantin level was undetectable. Her last seizure was on 10/01/17, she took a nap at noon and woke up feeling lightheaded like she would pass out, her boyfriend assisted her to the commode and she had a convulsion with tongue bite. Seizure lasted 2 minutes. Her Dilantin level was 7.3. She was discharged home on Dilantin 400mg  qhs. Her boyfriend reports some seizures are associated with head turn to the left side. She has occasional episodes where she feels she will have a seizure with the dizziness occurring 1-2 times a month, but it does not progress. Last episode was 2 days ago. Her boyfriend feels she would stare off after taking the Dilantin, it takes her a while to respond. She denies any gaps in time, olfactory/gustatory hallucinations, deja vu, rising epigastric sensation, focal numbness/tingling/weakness. She has occasional body jerks. She feels weird on the Dilantin, but denies any dizziness, diplopia, gait instability. She denies any headaches, neck/back pain, bowel/bladder dysfunction.   Epilepsy Risk Factors:  Her paternal uncle had epilepsy. Otherwise she had a normal birth and early  development.  There is no history of febrile convulsions, CNS infections such as meningitis/encephalitis, significant traumatic brain injury, neurosurgical procedures.  Prior AEDs: Keppra (itching)  PAST MEDICAL HISTORY: Past Medical History:  Diagnosis Date  . Seizures  (HCC)     MEDICATIONS: Current Outpatient Medications on File Prior to Visit  Medication Sig Dispense Refill  . Eslicarbazepine Acetate (APTIOM) 600 MG TABS Take 2 tablets by mouth daily 60 tablet 11               No current facility-administered medications on file prior to visit.     ALLERGIES: Allergies  Allergen Reactions  . Keppra [Levetiracetam] Itching    Mostly feet, hands too a little bit    FAMILY HISTORY: History reviewed. No pertinent family history.  SOCIAL HISTORY: Social History   Socioeconomic History  . Marital status: Single    Spouse name: Not on file  . Number of children: Not on file  . Years of education: Not on file  . Highest education level: Not on file  Occupational History  . Not on file  Social Needs  . Financial resource strain: Not on file  . Food insecurity:    Worry: Not on file    Inability: Not on file  . Transportation needs:    Medical: Not on file    Non-medical: Not on file  Tobacco Use  . Smoking status: Current Every Day Smoker  . Smokeless tobacco: Never Used  Substance and Sexual Activity  . Alcohol use: No  . Drug use: Yes    Types: Marijuana  . Sexual activity: Not on file  Lifestyle  . Physical activity:    Days per week: Not on file    Minutes per session: Not on file  . Stress: Not on file  Relationships  . Social connections:    Talks on phone: Not on file    Gets together: Not on file    Attends religious service: Not on file    Active member of club or organization: Not on file    Attends meetings of clubs or organizations: Not on file    Relationship status: Not on file  . Intimate partner violence:    Fear of current or ex partner: Not on file    Emotionally abused: Not on file    Physically abused: Not on file    Forced sexual activity: Not on file  Other Topics Concern  . 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    REVIEW OF SYSTEMS: Constitutional: No fevers, chills, or sweats, no generalized fatigue, change in appetite Eyes: No visual changes, double vision, eye pain Ear, nose and throat: No hearing loss, ear pain, nasal congestion, sore throat Cardiovascular: No chest pain, palpitations Respiratory:  No shortness of breath at rest or with exertion, wheezes GastrointestinaI: No nausea, vomiting, diarrhea, abdominal pain, fecal incontinence Genitourinary:  No dysuria, urinary retention or frequency Musculoskeletal:  No neck pain, back pain Integumentary: No rash, pruritus, skin lesions Neurological: as above Psychiatric: No depression, insomnia, anxiety Endocrine: No palpitations, fatigue, diaphoresis, mood swings, change in appetite, change in weight, increased thirst Hematologic/Lymphatic:  No anemia, purpura, petechiae. Allergic/Immunologic: no itchy/runny eyes, nasal congestion, recent allergic reactions, rashes  PHYSICAL EXAM: Vitals:   02/03/18 1149  BP: (!) 144/82  Pulse: 81  SpO2: 98%   General: No acute distress Head:  Normocephalic/atraumatic Neck: supple, no paraspinal tenderness, full range of motion Heart:  Regular rate and rhythm Lungs:  Clear to auscultation bilaterally Back: No paraspinal tenderness Skin/Extremities: No rash, no edema Neurological Exam: alert and oriented to person, place, and time. No aphasia or dysarthria. Fund of knowledge is appropriate.  Recent and remote memory are intact.  Attention and concentration are normal.    Able to name objects and repeat phrases. Cranial nerves: Pupils equal, round, reactive to light. Extraocular movements intact with no nystagmus. Visual fields full. Facial sensation intact. No facial asymmetry. Tongue, uvula, palate midline.  Motor: Bulk and tone normal, muscle strength 5/5 throughout with no pronator drift.  Sensation to  light touch intact.  No extinction to double simultaneous stimulation. Finger to nose testing intact.  Gait narrow-based and steady, able to tandem walk adequately.  Romberg negative.  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 She describes a stereotyped sensation of dizziness prior to the convulsions, may have left head turn with some of them. MRI brain normal, EEG showed rare right temporal focal slowing. Majority of seizures are nocturnal, but she had one daytime seizure last 01/18/18. None since then, continue Aptiom 1200mg  daily. She was advised to keep a calendar of auras, if frequent, we can increase dose up to 1600mg  daily. We may need to add on another AED if needed. SHe does not drive. She will follow-up in 3-4 months and knows to call for any changes.   Thank you for allowing me to participate in her care.  Please do not hesitate to call for any questions or concerns.  The duration of this appointment visit was 20 minutes of face-to-face time with the patient.  Greater than 50% of this time was spent in counseling, explanation of diagnosis, planning of further management, and coordination of care.   Patrcia Dolly, M.D.   CC: [facility]

## 2018-02-03 NOTE — Patient Instructions (Signed)
1. Continue Aptiom 600mg : take 2 tablets daily. See if you feel any different taking medication once a day with the restlessness in your legs  2. Keep a calendar of the feelings you have before a seizure. If they are happening frequently, we will increase dose of Aptiom.  3. Follow-up in 3 months, call for any changes  Seizure Precautions: 1. If medication has been prescribed for you to prevent seizures, take it exactly as directed.  Do not stop taking the medicine without talking to your doctor first, even if you have not had a seizure in a long time.   2. Avoid activities in which a seizure would cause danger to yourself or to others.  Don't operate dangerous machinery, swim alone, or climb in high or dangerous places, such as on ladders, roofs, or girders.  Do not drive unless your doctor says you may.  3. If you have any warning that you may have a seizure, lay down in a safe place where you can't hurt yourself.    4.  No driving for 6 months from last seizure, as per [facility].   Please refer to the following link on the Epilepsy Foundation of America's website for more information: http://www.epilepsyfoundation.org/answerplace/Social/driving/drivingu.cfm   5.  Maintain good sleep hygiene.  6.  Notify your neurology if you are planning pregnancy or if you become pregnant.  7.  Contact your doctor if you have any problems that may be related to the medicine you are taking.  8.  Call 911 and bring the patient back to the ED if:        A.  The seizure lasts longer than 5 minutes.       B.  The patient doesn't awaken shortly after the seizure  C.  The patient has new problems such as difficulty seeing, speaking or moving  D.  The patient was injured during the seizure  E.  The patient has a temperature over 102 F (39C)  F.  The patient vomited and now is having trouble breathing

## 2018-02-04 ENCOUNTER — Encounter: Payer: Self-pay | Admitting: Neurology

## 2018-02-11 ENCOUNTER — Ambulatory Visit: Payer: Self-pay | Admitting: Neurology

## 2018-02-16 ENCOUNTER — Telehealth: Payer: Self-pay | Admitting: Neurology

## 2018-02-16 NOTE — Telephone Encounter (Signed)
Patient just had a seizure. She only has 1 tablet of the Aptiom medication. Boyfriend called [facility] to get refill and it wont be available until tomorrow night. Is there a way you can send it in for early pick up today? Also he wanted to speak with you regarding patient. Please call him back at [phone]. Thanks!

## 2018-02-17 NOTE — Telephone Encounter (Signed)
Patient boyfriend called several times today and states that he needs the medication Aptiom called into the [facility] on [address]. She is only has one pill and she had a seizure yesterday please call you may call him or the patient at [phone]

## 2018-02-17 NOTE — Telephone Encounter (Signed)
Spoke with pt's boyfriend who states that pharmacy told him that pt could not pick up Aptiom until tonight after 5pm.  Asked if I could call pharmacy and tell them to let pt pick up earlier.  I advised that it sounded more like that pharmacy had to order the medication and it will be available after count, at 5pm.  Boyfriend expressed understanding.  I asked if pt still had 400mg  Tabs on hand - she does.  Advised to take (3) 400mg  Tabs daily until pt can pick up 600mg  Tabs, then only take 2 Tabs daily.

## 2018-03-01 ENCOUNTER — Telehealth: Payer: Self-pay | Admitting: Neurology

## 2018-03-01 MED ORDER — ESLICARBAZEPINE ACETATE 800 MG PO TABS: ORAL_TABLET | ORAL | 11 refills | Status: DC

## 2018-03-01 NOTE — Telephone Encounter (Signed)
Let's increase to Aptiom 800mg : take 2 tablets daily. Thanks

## 2018-03-01 NOTE — Telephone Encounter (Signed)
Spoke with pt - she states that she has been experiencing seizures over the past few days - specifically 2 yesterday.  I asked how pt was taking her Aptiom - she states 2 tabs daily.  I asked if she had any more 400mg  tabs on hand (previous note states possible increase to 1600mg /day) pt states that she has never had 400mg  tablet, only 600mg  and 800mg .  Pt has (2) 800mg  tablets on hand and about half a bottle of 600mg  Tablets.  I let pt know that we would most likely send new Rx to pharmacy for increase but that I need Dr. Rosalyn GessAquino's approval first.  pls advise.

## 2018-03-01 NOTE — Telephone Encounter (Signed)
Spoke with pt relaying message below.  Verified preferred pharmacy.  Aptiom 800mg  #60 with 11 refills Sig = Take 2 tablets each day  [facility] [address]

## 2018-03-01 NOTE — Telephone Encounter (Signed)
Patient's boyfriend is calling again needing a return phone call. Please call him at [phone]. Thanks!

## 2018-03-01 NOTE — Telephone Encounter (Signed)
Patient and the patient  Boyfriend were on the phone wanting a call back today. She has had two seizures last week and one yesterday around 4:00. They are not sure if it is the medication or what. They would like a call back

## 2018-03-05 ENCOUNTER — Telehealth: Payer: Self-pay | Admitting: Neurology

## 2018-03-05 NOTE — Telephone Encounter (Signed)
She definitely needs to continue her medications, risks to unborn baby are low on her medication. Has she had any further seizures? Thanks

## 2018-03-05 NOTE — Telephone Encounter (Signed)
Spoke with pt.  She states that pregnancy has not been confirmed, but she plans to go to Urgent Care tonight to speak with a doctor and obtain a blood test.  Asked if she had a positive home test.  Pt states that home test was negative but she does not trust it as it was a test from the dollar store and not a drug store.  Advised pt to continue her medications as directed.

## 2018-03-05 NOTE — Telephone Encounter (Signed)
Patient called and left a vm that she thinks she is pregnant and wants to know if she needs to keep taking her medications.  Please call her back at [phone]. Thanks!

## 2018-03-05 NOTE — Telephone Encounter (Signed)
pls advise before I return call.

## 2018-03-15 ENCOUNTER — Telehealth: Payer: Self-pay

## 2018-03-15 NOTE — Telephone Encounter (Signed)
Received after hours / weekend message stating that pt experienced another seizure over the holiday weekend lasting about 1 minute.    Verbal per Dr. Karel JarvisAquino - schedule 48 hour ambulatory EEG / add Vimpat 50mg  BID to Aptiom.  Pt may experience dizziness.  If this happens, pt to let us know as we may lower Aptiom.

## 2018-04-27 ENCOUNTER — Ambulatory Visit: Payer: Self-pay | Admitting: Neurology

## 2018-07-26 ENCOUNTER — Telehealth: Payer: Self-pay | Admitting: Neurology

## 2018-07-26 ENCOUNTER — Other Ambulatory Visit: Payer: Self-pay

## 2018-07-26 MED ORDER — LACOSAMIDE 50 MG PO TABS: 50.0000 mg | ORAL_TABLET | Freq: Two times a day (BID) | ORAL | 5 refills | Status: DC

## 2018-07-26 NOTE — Addendum Note (Signed)
Addended by: Horatio Pel on: 07/26/2018 01:12 PM   Modules accepted: Orders

## 2018-07-26 NOTE — Telephone Encounter (Signed)
Boyfriend calling in about medication. Wanting to know if we put her on some new meds. Please call him back. Thanks!

## 2018-07-26 NOTE — Telephone Encounter (Signed)
LMOM relaying message below.    vimpat 50mg  #60 with 5 refills Sig - take 1 tab BID  Forwarded to Dr. Karel Jarvis for approval

## 2018-07-26 NOTE — Telephone Encounter (Signed)
Since we are unable to do EEG, would be best to treat her even without test. Pls call in Vimpat 50mg  BID, let them know she may feel a little dizzy the first week or so, but should go away as she gets more used to medication. Stay on same dose of Aptiom. Let us know if any issues, thanks

## 2018-07-26 NOTE — Telephone Encounter (Signed)
Received call from pt's boyfriend who states that pt had a seizure this morning. I asked about possible triggers  Sleep - good No missed medications No alcohol consumption in last 48 Hrs.  Stress level is high per boyfriend.   I advised that Dr. Karel Jarvis would like pt to have ambulatory EEG (this was relayed to boyfriend in December) but that we are not doing EEGs at this time (COVID) Will place order and wait to schedule.    Dr. Karel Jarvis - last note states possible Vimpat addition depending on EEG results. EEG never done (Boyfriend wanted to wait, but never called to schedule)  pls advise.

## 2018-07-27 NOTE — Telephone Encounter (Signed)
Returned call.  No answer.  Left second message on machine stating that VIMPAT has been added to pt's medications.  Pt is to take same amount of Aptiom along with the addition of VIMPAT (1 tab BID)

## 2018-08-02 ENCOUNTER — Telehealth: Payer: Self-pay | Admitting: Neurology

## 2018-08-02 NOTE — Telephone Encounter (Signed)
Looks like they have been been playing phone tag with [facility] previously, can you pls confirm that she is taking both Aptiom 800mg  2 tabs daily and Vimpat 50mg  BID? How long has she been on the Vimpat 50mg  BID? If it's been a week, can increase to 100mg  BID. Let us know if this causes too much dizziness. Thanks

## 2018-08-02 NOTE — Telephone Encounter (Signed)
Contacted the patient back and she states she has only been on the medication for 3 days. The patient stated she will call back when she has been on it for 1 week to let us know how she is feeling.

## 2018-08-02 NOTE — Telephone Encounter (Signed)
Spoke with Wendy Stephenson and she stated that she believes she almost had a seizure yesterday. She stated she got real dizzy and felt like she does before she has one. Patient admits to having some stress in her life. She wants to make sure she is on the right medication and doseage. Please advise.

## 2018-08-02 NOTE — Telephone Encounter (Signed)
Patient called regarding her Vimpat 50 MG medication. She said she almost had a seizure yesterday but didn't. She lmom regarding needing more medication? Please Call. Thanks

## 2018-09-07 ENCOUNTER — Telehealth: Payer: Self-pay | Admitting: Neurology

## 2018-09-07 NOTE — Telephone Encounter (Signed)
Patient is needing paperwork saying she is unable to work for her disability claim. Please write this up. Thanks!

## 2018-09-08 NOTE — Telephone Encounter (Signed)
Needs letter addressed to  social security

## 2018-09-08 NOTE — Telephone Encounter (Signed)
Pls ask her if there are any forms or who do I address this letter to? Thanks

## 2018-09-22 ENCOUNTER — Encounter: Payer: Self-pay | Admitting: Neurology

## 2018-09-22 NOTE — Telephone Encounter (Signed)
Done, pls let her know letter is ready for pick up. Thanks

## 2018-09-22 NOTE — Telephone Encounter (Signed)
Pt called informed that her letter is ready and at the front for her to come by and pick it up.

## 2018-10-06 ENCOUNTER — Telehealth: Payer: Self-pay | Admitting: Neurology

## 2018-10-06 ENCOUNTER — Telehealth: Payer: Self-pay

## 2018-10-06 NOTE — Telephone Encounter (Signed)
Paper work faxed to [employer] at [phone]. Copy of letter still  at front for pt to pick up from 09/22/2018

## 2018-10-06 NOTE — Telephone Encounter (Signed)
Patient is calling in wanting to know if any paperwork was faxed over to her lawyers office. Please call her back and let her know. Thanks!

## 2018-10-06 NOTE — Telephone Encounter (Signed)
Spoke with pt will fax letter to Judson Roch at [phone]

## 2018-11-02 ENCOUNTER — Other Ambulatory Visit: Payer: Self-pay | Admitting: Neurology

## 2018-11-02 MED ORDER — VIMPAT 100 MG PO TABS: ORAL_TABLET | ORAL | 5 refills | Status: DC

## 2018-11-02 NOTE — Telephone Encounter (Signed)
Pt never increased the Vimpat. She continues to take 50mg  BID.Pt also takes Aptiom BID.  She states that she woke from her sleep had the seizure. Pt feels fine now just weak.

## 2018-11-02 NOTE — Telephone Encounter (Signed)
Pls let her know I will send in a new Rx for increase in Vimpat 100mg : Take 1 tablet twice a day. Is she taking the Aptiom 800mg  1 tab BID? Thanks

## 2018-11-02 NOTE — Telephone Encounter (Signed)
Pt said she felt like she was going to have seizure while lying in   Lasted about 2 minutes.  Boyfriend confirms that pt has been taking meds as prescribed.    Aptiom is a new med and Vimpat.  Sleeping pretty good. Drinks a lot of water. Has occ. Soda.  No new signs of infection.  Pt is alert now.

## 2018-11-02 NOTE — Telephone Encounter (Signed)
Pls confirm doses of medications. If she is still on Vimpat 50mg  BID, I had wanted to increase it to 100mg  BID. She may feel dizzy when she first increases again, but this usually goes away as her body gets used to medication. Continue Aptiom. Thanks

## 2018-11-03 NOTE — Telephone Encounter (Signed)
Pt made aware to increase Vimpat to 100mg . Take 1 tab BID. Pt confirmed that she is taking Aptiom 800mg  BID.

## 2018-11-11 ENCOUNTER — Telehealth: Payer: Self-pay | Admitting: Neurology

## 2018-11-11 NOTE — Telephone Encounter (Addendum)
Pt c/o: seizure. She had taken her morning meds. Seizure was 5 minutes. She fell asleep afterwards and now feels fine except for a headache and sore tongue (bit tongue).  Missed medications?  No. Sleep deprived?  No. Alcohol intake?  No. Back to their usual baseline self? YES   Current medications prescribed by Dr. Delice Lesch: Vimpat 100mg  BID                                                                             Aptiom 800 mg (2 tabs) daily    I did clairify the Aptiom 800mg  and she stated she is taking 2 tabs of the 800mg  at night (NOT BID). She does have an appt with you 8/7 for follow up.  Please let me know if you want me to instruct patient on any changes at this time. Thanks.

## 2018-11-11 NOTE — Telephone Encounter (Signed)
Patient changed meds recently and had a seizure this morning. It lasted about 5 mins and she went to sleep right after. She is feeling better now but the medication change was to help with seizures and she wanted to speak with a nurse. Thanks!

## 2018-11-12 MED ORDER — VIMPAT 100 MG PO TABS: 150.0000 mg | ORAL_TABLET | Freq: Two times a day (BID) | ORAL | 5 refills | Status: DC

## 2018-11-12 NOTE — Telephone Encounter (Signed)
Pls have her increase the Vimpat 100mg : Take 1.5 tabs BID, continue Aptiom 800mg  2 tabs qhs. Thanks

## 2018-11-12 NOTE — Addendum Note (Signed)
Addended by: Jesse Fall on: 11/12/2018 02:53 PM   Modules accepted: Orders

## 2018-11-12 NOTE — Telephone Encounter (Addendum)
Spoke to patient and made her aware that MD wants her to increase Vimpat (100mg ) to 1.5 tabs BID. The Aptiom remains the same. Sent to her pharmacy [facility] on [address].

## 2018-11-19 ENCOUNTER — Encounter: Payer: Self-pay | Admitting: Neurology

## 2018-11-19 ENCOUNTER — Telehealth (INDEPENDENT_AMBULATORY_CARE_PROVIDER_SITE_OTHER): Payer: Medicaid Other | Admitting: Neurology

## 2018-11-19 ENCOUNTER — Other Ambulatory Visit: Payer: Self-pay

## 2018-11-19 VITALS — Temp 97.3°F | Ht 62.0 in | Wt 226.0 lb

## 2018-11-19 DIAGNOSIS — G40009 Localization-related (focal) (partial) idiopathic epilepsy and epileptic syndromes with seizures of localized onset, not intractable, without status epilepticus: Secondary | ICD-10-CM

## 2018-11-19 DIAGNOSIS — G40019 Localization-related (focal) (partial) idiopathic epilepsy and epileptic syndromes with seizures of localized onset, intractable, without status epilepticus: Secondary | ICD-10-CM

## 2018-11-19 MED ORDER — LACOSAMIDE 200 MG PO TABS: 200.0000 mg | ORAL_TABLET | Freq: Two times a day (BID) | ORAL | 11 refills | Status: DC

## 2018-11-19 MED ORDER — APTIOM 800 MG PO TABS: ORAL_TABLET | ORAL | 11 refills | Status: DC

## 2018-11-19 NOTE — Progress Notes (Signed)
Virtual Visit via Video Note The purpose of this virtual visit is to provide medical care while limiting exposure to the novel coronavirus.    Consent was obtained for video visit:  Yes.   Answered questions that patient had about telehealth interaction:  Yes.   I discussed the limitations, risks, security and privacy concerns of performing an evaluation and management service by telemedicine. I also discussed with the patient that there may be a patient responsible charge related to this service. The patient expressed understanding and agreed to proceed.  Pt location: Home Physician Location: office Name of referring provider:  [facility] I connected with DAILYNN NANCARROW at patients initiation/request on 11/19/2018 at  8:30 AM EDT by video enabled telemedicine application and verified that I am speaking with the correct person using two identifiers. Pt MRN:  [id number] Pt DOB:  Jun 28, 1990 Video Participants:  [name];  Debera Lat (significant other)   History of Present Illness:  The patient was seen as a virtual video visit on 11/19/2018. She was last seen in October 2019 for seizures. She was initially having nocturnal seizures, however recent seizures have been in wakefulness. She had been tolerating Aptiom 1600mg  qhs and went seizure-free for 6 months, then started having recurrent seizures again in April 2020. Vimpat was added with increasing doses due to continued seizures. She had a seizure on 6/24, 7/20, and most recently on 7/30 preceded by olfactory hallucination, rising epigastric sensation, followed by a GTC with tongue bite. No focal weakness. She has a very bad headache after a seizure, "like my brain is going to bust." Her boyfriend has also witnessed focal seizures where she is in a daze, unresponsive, rocking her body back and forth, occurring 1-2 times a week. Vimpat was increased to 150mg  BID last week. She occasionally feels dizzy where her head is spinning  and she will fall. She has rare headaches.   History on Initial Assessment 10/29/2017: This is a pleasant 28 year old right-handed woman with no significant past medical history, in her usual state of health until 04/27/17 when she recalls feeling warm, she went to turn on the fan and then to the bathroom, then her boyfriend heard a fall and saw her having a full body convulsion lasting 2 minutes. She was confused after, no focal weakness. She bit her tongue, no incontinence. Bloodwork showed an elevated WBC of 19.1, normal BMP. I personally reviewed CT head without contrast which was normal. She had another episode while sleeping on 07/09/17. She woke up feeling odd, dizzy like she would pass out. She sat up then started having a convulsion lasting 2 minutes. She was again brought to the ER, WBC was 20, BMP normal. I personally reviewed MRI brain with and without contrast which was normal, hippocampi symmetric with no abnormal signal or enhancement seen. She was discharged home on Keppra 500mg  BID but had itching that was not relieved with Benadryl, so she stopped the medication. She was back in the ER on 08/21/17 with another convulsion and was discharged home on Dilantin. She was back on 10/01/17 with another seizure out of sleep, she woke up feeling hot, tried to go back to sleep, then woke up in the ambulance. Her boyfriend again noted a convulsive seizure. Dilantin level was undetectable. Her last seizure was on 10/01/17, she took a nap at noon and woke up feeling lightheaded like she would pass out, her boyfriend assisted her to the commode and she had  a convulsion with tongue bite. Seizure lasted 2 minutes. Her Dilantin level was 7.3. She was discharged home on Dilantin 400mg  qhs. Her boyfriend reports some seizures are associated with head turn to the left side. She has occasional episodes where she feels she will have a seizure with the dizziness occurring 1-2 times a month, but it does not progress. Last  episode was 2 days ago. Her boyfriend feels she would stare off after taking the Dilantin, it takes her a while to respond. She denies any gaps in time, olfactory/gustatory hallucinations, deja vu, rising epigastric sensation, focal numbness/tingling/weakness. She has occasional body jerks. She feels weird on the Dilantin, but denies any dizziness, diplopia, gait instability. She denies any headaches, neck/back pain, bowel/bladder dysfunction.   Epilepsy Risk Factors:  Her paternal uncle had epilepsy. Otherwise she had a normal birth and early development.  There is no history of febrile convulsions, CNS infections such as meningitis/encephalitis, significant traumatic brain injury, neurosurgical procedures.  Prior AEDs: Keppra (itching), Zonisamide (itching)  Diagnostic Data:  MRI brain with and without contrast done 06/2017: No acute changes. Hippocampi symmetric with no abnormal signal or enhancement seen. 1-hour wake and sleep EEG done 10/2017: Rare focal slowing over the right temporal region in sleep   Outpatient Encounter Medications as of 11/19/2018  Medication Sig   Eslicarbazepine Acetate (APTIOM) 800 MG TABS Take 2 tablets by mouth each day.       Lacosamide (VIMPAT) 100 MG TABS Take 1.5 tablets (150 mg total) by mouth 2 (two) times a day.       No facility-administered encounter medications on file as of 11/19/2018.      Observations/Objective:   Vitals:   11/19/18 0805  Temp: (!) 97.3 F (36.3 C)  Weight: 226 lb (102.5 kg)  Height: 5\' 2"  (1.575 m)   GEN:  The patient appears stated age and is in NAD.  Neurological examination: Patient is awake, alert, oriented x 3. No aphasia or dysarthria. Intact fluency and comprehension. Remote and recent memory intact. Able to name and repeat. Cranial nerves: Extraocular movements intact with no nystagmus. No facial asymmetry. Motor: moves all extremities symmetrically, at least anti-gravity x 4. No incoordination on finger to nose  testing. Gait: narrow-based and steady, able to tandem walk adequately. Negative Romberg test.   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 She describes a stereotyped sensation of dizziness, olfactory hallucination prior to the convulsions, may have left head turn with some of them. MRI brain normal, EEG showed rare right temporal focal slowing. Last GTC was 11/11/2018. She continues to have focal seizures 1-2 times a week. Increase Vimpat to 200mg  BID, continue Aptiom 1600mg  qhs. We discussed that she has in pretty good doses of 2 AEDs, we may add on Topiramate however at this point would refer for presurgical evaluation at tertiary center. This was discussed at length with patient and boyfriend today, they expressed agreement. She does not drive. She will follow-up in 3-4 months and knows to call for any changes.    Follow Up Instructions:   -I discussed the assessment and treatment plan with the patient. The patient was provided an opportunity to ask questions and all were answered. The patient agreed with the plan and demonstrated an understanding of the instructions.   The patient was advised to call back or seek an in-person evaluation if the symptoms worsen or if the  condition fails to improve as anticipated.    Van ClinesKaren M Damone Fancher, MD

## 2018-12-27 ENCOUNTER — Telehealth: Payer: Self-pay | Admitting: Neurology

## 2018-12-27 NOTE — Telephone Encounter (Signed)
Patient had 3 seizure this weekend they would liek to speak to someone about this please call patient at [phone] or Aundra at [phone]

## 2018-12-28 NOTE — Telephone Encounter (Signed)
Pt c/o: seizure Missed medications?  No. Sleep deprived?  Yes.   Alcohol intake?  No. Back to their usual baseline self?  Yes.  . If no, advise go to ER Current medications prescribed by Dr. Delice Lesch: Vimpat. Takes one in the morning and one at night as prescribed.  Pt feels that stress is what triggered the seizures. She will call back with concerns.

## 2019-02-07 ENCOUNTER — Telehealth: Payer: Self-pay | Admitting: Neurology

## 2019-02-07 NOTE — Telephone Encounter (Signed)
Patient had a seizure yesterday and they were calling to let us know

## 2019-03-14 ENCOUNTER — Telehealth (INDEPENDENT_AMBULATORY_CARE_PROVIDER_SITE_OTHER): Payer: Medicaid Other | Admitting: Neurology

## 2019-03-14 ENCOUNTER — Other Ambulatory Visit: Payer: Self-pay

## 2019-03-14 ENCOUNTER — Telehealth: Payer: Self-pay | Admitting: Neurology

## 2019-03-14 ENCOUNTER — Encounter: Payer: Self-pay | Admitting: Neurology

## 2019-03-14 VITALS — Ht 62.0 in

## 2019-03-14 DIAGNOSIS — G40019 Localization-related (focal) (partial) idiopathic epilepsy and epileptic syndromes with seizures of localized onset, intractable, without status epilepticus: Secondary | ICD-10-CM | POA: Diagnosis not present

## 2019-03-14 MED ORDER — LACOSAMIDE 200 MG PO TABS: 200.0000 mg | ORAL_TABLET | Freq: Two times a day (BID) | ORAL | 5 refills | Status: DC

## 2019-03-14 MED ORDER — APTIOM 800 MG PO TABS: ORAL_TABLET | ORAL | 11 refills | Status: DC

## 2019-03-14 MED ORDER — BRIVIACT 100 MG PO TABS: 1.0000 | ORAL_TABLET | Freq: Two times a day (BID) | ORAL | 5 refills | Status: DC

## 2019-03-14 NOTE — Progress Notes (Signed)
Virtual Visit via Video Note The purpose of this virtual visit is to provide medical care while limiting exposure to the novel coronavirus.    Consent was obtained for video visit:  Yes.   Answered questions that patient had about telehealth interaction:  Yes.   I discussed the limitations, risks, security and privacy concerns of performing an evaluation and management service by telemedicine. I also discussed with the patient that there may be a patient responsible charge related to this service. The patient expressed understanding and agreed to proceed.  Pt location: Home Physician Location: office Name of referring provider:  [facility] I connected with Wendy Stephenson at patients initiation/request on 03/14/2019 at  2:00 PM EST by video enabled telemedicine application and verified that I am speaking with the correct person using two identifiers. Pt MRN:  [id number] Pt DOB:  March 11, 1991 Video Participants:  Wendy Stephenson;  Wendy Stephenson (significant other)   History of Present Illness:  The patient had a virtual video visit on 03/14/2019. She was last seen 3 months ago for recurrent seizures. On her last visit, Vimpat dose was increased to 200mg  BID. She is also on Aptiom 1600mg  daily. She reports 5 seizures in the last 3 months. She had one today while watching TV, she had the weird smell, then lost awareness. Her last GTC was on 02/28/2019. After the seizures, she has significant headaches "like my brain is going to bust." There is throbbing, worse when coughing. She feels dizzy. No focal weakness. She is not sure if she is stressed, she has not been sleeping well, usually with 5-6 hours of sleep. She denies any side effects to medications, no missed doses.   History on Initial Assessment 10/29/2017: This is a pleasant 28 year old right-handed woman with no significant past medical history, in her usual state of health until 04/27/17 when she recalls feeling warm, she went to turn  on the fan and then to the bathroom, then her boyfriend heard a fall and saw her having a full body convulsion lasting 2 minutes. She was confused after, no focal weakness. She bit her tongue, no incontinence. Bloodwork showed an elevated WBC of 19.1, normal BMP. I personally reviewed CT head without contrast which was normal. She had another episode while sleeping on 07/09/17. She woke up feeling odd, dizzy like she would pass out. She sat up then started having a convulsion lasting 2 minutes. She was again brought to the ER, WBC was 20, BMP normal. I personally reviewed MRI brain with and without contrast which was normal, hippocampi symmetric with no abnormal signal or enhancement seen. She was discharged home on Keppra 500mg  BID but had itching that was not relieved with Benadryl, so she stopped the medication. She was back in the ER on 08/21/17 with another convulsion and was discharged home on Dilantin. She was back on 10/01/17 with another seizure out of sleep, she woke up feeling hot, tried to go back to sleep, then woke up in the ambulance. Her boyfriend again noted a convulsive seizure. Dilantin level was undetectable. Her last seizure was on 10/01/17, she took a nap at noon and woke up feeling lightheaded like she would pass out, her boyfriend assisted her to the commode and she had a convulsion with tongue bite. Seizure lasted 2 minutes. Her Dilantin level was 7.3. She was discharged home on Dilantin 400mg  qhs. Her boyfriend reports some seizures are associated with head turn to the left side. She  has occasional episodes where she feels she will have a seizure with the dizziness occurring 1-2 times a month, but it does not progress. Last episode was 2 days ago. Her boyfriend feels she would stare off after taking the Dilantin, it takes her a while to respond. She denies any gaps in time, olfactory/gustatory hallucinations, deja vu, rising epigastric sensation, focal numbness/tingling/weakness. She has  occasional body jerks. She feels weird on the Dilantin, but denies any dizziness, diplopia, gait instability. She denies any headaches, neck/back pain, bowel/bladder dysfunction.   Epilepsy Risk Factors:  Her paternal uncle had epilepsy. Otherwise she had a normal birth and early development.  There is no history of febrile convulsions, CNS infections such as meningitis/encephalitis, significant traumatic brain injury, neurosurgical procedures.  Prior AEDs: Keppra (itching), Zonisamide (itching), Dilantin  Diagnostic Data:  MRI brain with and without contrast done 06/2017: No acute changes. Hippocampi symmetric with no abnormal signal or enhancement seen. 1-hour wake and sleep EEG done 10/2017: Rare focal slowing over the right temporal region in sleep  Outpatient Encounter Medications as of 03/14/2019  Medication Sig  . Eslicarbazepine Acetate (APTIOM) 800 MG TABS Take 2 tablets by mouth each day.  . lacosamide (VIMPAT) 200 MG TABS tablet Take 1 tablet (200 mg total) by mouth 2 (two) times daily.               No facility-administered encounter medications on file as of 03/14/2019.     Observations/Objective:   Vitals:   03/14/19 1131  Height: 5\' 2"  (1.575 m)   GEN:  The patient appears stated age and is in NAD.  Neurological examination: Patient is awake, alert, oriented x 3. No aphasia or dysarthria. Intact fluency and comprehension. Remote and recent memory intact. Able to name and repeat. Cranial nerves: Extraocular movements intact with no nystagmus. No facial asymmetry. Motor: moves all extremities symmetrically, at least anti-gravity x 4.    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 She describes a stereotyped sensation of dizziness, olfactory hallucination prior to the convulsions, may have left head turn with some of them. MRI brain normal, EEG showed rare right  temporal focal slowing. Last GTC was 02/28/2019, she had a focal seizure today preceded by olfactory hallucination. We discussed adding on Briviact to her current regimen of Vimpat 200mg  BID and Aptiom 1600mg  qhs. Side effects discussed, start Briviact 50mg  BID for a week, then increase to 100mg  BID. At this point, she continues to have recurrent seizures on therapeutic doses of 2 AEDs, and has tried several AEDs with continued seizures. We discussed consideration for other options aside from medication, particularly presurgical evaluation. She would like to proceed, referral to [place] will be sent. She does not drive. She will follow-up in 3-4 months and knows to call for any changes.    Follow Up Instructions:   -I discussed the assessment and treatment plan with the patient. The patient was provided an opportunity to ask questions and all were answered. The patient agreed with the plan and demonstrated an understanding of the instructions.   The patient was advised to call back or seek an in-person evaluation if the symptoms worsen or if the condition fails to improve as anticipated.     Cameron Sprang, MD

## 2019-03-14 NOTE — Telephone Encounter (Signed)
Pt did not have seizure. She is currently lying down watching TV per boyfriend. Pt  Has been having seizures more frequently. Continues to take medications as prescribed. They will keep appt at 2pm today.

## 2019-03-14 NOTE — Telephone Encounter (Signed)
Spouse is calling in that patient thinks she is about to have a seizure. She is scheduled for the 2PM VV spot today but she is feeling like on is coming on. Thanks!

## 2019-03-15 ENCOUNTER — Telehealth: Payer: Self-pay | Admitting: Neurology

## 2019-03-15 NOTE — Telephone Encounter (Signed)
Patient called requesting a call back with questions about her brivaracetam 100 MG medication.

## 2019-03-15 NOTE — Telephone Encounter (Signed)
Questions about medication answered.

## 2019-03-21 ENCOUNTER — Other Ambulatory Visit: Payer: Self-pay

## 2019-03-21 DIAGNOSIS — G40009 Localization-related (focal) (partial) idiopathic epilepsy and epileptic syndromes with seizures of localized onset, not intractable, without status epilepticus: Secondary | ICD-10-CM

## 2019-03-21 DIAGNOSIS — G40019 Localization-related (focal) (partial) idiopathic epilepsy and epileptic syndromes with seizures of localized onset, intractable, without status epilepticus: Secondary | ICD-10-CM

## 2019-06-06 ENCOUNTER — Telehealth: Payer: Self-pay | Admitting: Neurology

## 2019-06-06 NOTE — Telephone Encounter (Signed)
Spoke with pt she stated that after starting the Briciavt her mood has been bad she has been mean to her partner and son they do not want to be around her, her and her partner have been fighting, she doesn't want to go out,  Pt was crying on the phone stated at it is going to destroy her relationship with her son and partner, asking for help, she stated that daily people are telling her now that she is mean, and she said normally she is a very nice woman to be around,

## 2019-06-06 NOTE — Telephone Encounter (Signed)
Patient wants to speak  To someone about her Mood swing and her feeling pure evil towards her son and family. She noticed it when she started this medication   She does not feel like she is going to hurt herself or anyone else

## 2019-06-06 NOTE — Telephone Encounter (Signed)
Pt called no answer voice mail left for her to call back   

## 2019-06-07 ENCOUNTER — Encounter: Payer: Self-pay | Admitting: Neurology

## 2019-06-07 ENCOUNTER — Telehealth (INDEPENDENT_AMBULATORY_CARE_PROVIDER_SITE_OTHER): Payer: Medicaid Other | Admitting: Neurology

## 2019-06-07 ENCOUNTER — Other Ambulatory Visit: Payer: Self-pay

## 2019-06-07 VITALS — Ht 63.0 in | Wt 240.0 lb

## 2019-06-07 DIAGNOSIS — G40019 Localization-related (focal) (partial) idiopathic epilepsy and epileptic syndromes with seizures of localized onset, intractable, without status epilepticus: Secondary | ICD-10-CM | POA: Diagnosis not present

## 2019-06-07 MED ORDER — APTIOM 800 MG PO TABS: ORAL_TABLET | ORAL | 11 refills | Status: DC

## 2019-06-07 MED ORDER — LACOSAMIDE 200 MG PO TABS: 200.0000 mg | ORAL_TABLET | Freq: Two times a day (BID) | ORAL | 5 refills | Status: DC

## 2019-06-07 MED ORDER — DIVALPROEX SODIUM ER 500 MG PO TB24: ORAL_TABLET | ORAL | 11 refills | Status: DC

## 2019-06-07 NOTE — Progress Notes (Signed)
Virtual Visit via Video Note The purpose of this virtual visit is to provide medical care while limiting exposure to the novel coronavirus.    Consent was obtained for video visit:  Yes.   Answered questions that patient had about telehealth interaction:  Yes.   I discussed the limitations, risks, security and privacy concerns of performing an evaluation and management service by telemedicine. I also discussed with the patient that there may be a patient responsible charge related to this service. The patient expressed understanding and agreed to proceed.  Pt location: Home Physician Location: office Name of referring provider:  [facility] I connected with Wendy Stephenson at patients initiation/request on 06/07/2019 at  3:00 PM EST by video enabled telemedicine application and verified that I am speaking with the correct person using two identifiers. Pt MRN:  [id number] Pt DOB:  1990-08-25 Video Participants:  Wendy Stephenson   History of Present Illness:  The patient was seen as a virtual video visit on 06/07/2019. 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 On her last visit, she and her fiance continued to report 5 seizures in 3 months while on Aptiom 1600mg  daily and Vimpat 200mg  BID. We added on Briviact 100mg  BID, she denies any convulsions since 02/28/2019, however has had significant mood swings with initiation of Briviact. She has been mean to her partner and son that they do not want to be around her, she does not want to go out, crying, with people saying she is mean on a daily basis. She would wake up angry. She thinks the medication was making her be "out in space," but on further questioning it appears that although she has not had any convulsions, she is having focal seizures with impaired awareness where she is rocking in her chair, "out in space" and not responding to [place],  occurring around twice a week. Last episode was 2 days ago. She is amnestic of them, with no prior warning or post-ictal symptoms. She denies any headaches, dizziness, focal numbness/tingling/weakness, no falls.   History on Initial Assessment 10/29/2017: This is a pleasant 29 year old right-handed woman with no significant past medical history, in her usual state of health until 04/27/17 when she recalls feeling warm, she went to turn on the fan and then to the bathroom, then her boyfriend heard a fall and saw her having a full body convulsion lasting 2 minutes. She was confused after, no focal weakness. She bit her tongue, no incontinence. Bloodwork showed an elevated WBC of 19.1, normal BMP. I personally reviewed CT head without contrast which was normal. She had another episode while sleeping on 07/09/17. She woke up feeling odd, dizzy like she would pass out. She sat up then started having a convulsion lasting 2 minutes. She was again brought to the ER, WBC was 20, BMP normal. I personally reviewed MRI brain with and without contrast which was normal, hippocampi symmetric with no abnormal signal or enhancement seen. She was discharged home on Keppra 500mg  BID but had itching that was not relieved with Benadryl, so she stopped the medication. She was back in the ER on 08/21/17 with another convulsion and was discharged home on Dilantin. She was back on 10/01/17 with another seizure out of sleep, she woke up feeling hot, tried to go back to sleep, then woke up in the ambulance. Her boyfriend again noted a convulsive seizure.  Dilantin level was undetectable. Her last seizure was on 10/01/17, she took a nap at noon and woke up feeling lightheaded like she would pass out, her boyfriend assisted her to the commode and she had a convulsion with tongue bite. Seizure lasted 2 minutes. Her Dilantin level was 7.3. She was discharged home on Dilantin 400mg  qhs. Her boyfriend reports some seizures are associated with head turn  to the left side. She has occasional episodes where she feels she will have a seizure with the dizziness occurring 1-2 times a month, but it does not progress. Last episode was 2 days ago. Her boyfriend feels she would stare off after taking the Dilantin, it takes her a while to respond. She denies any gaps in time, olfactory/gustatory hallucinations, deja vu, rising epigastric sensation, focal numbness/tingling/weakness. She has occasional body jerks. She feels weird on the Dilantin, but denies any dizziness, diplopia, gait instability. She denies any headaches, neck/back pain, bowel/bladder dysfunction.   Epilepsy Risk Factors:  Her paternal uncle had epilepsy. Otherwise she had a normal birth and early development.  There is no history of febrile convulsions, CNS infections such as meningitis/encephalitis, significant traumatic brain injury, neurosurgical procedures.  Prior AEDs: Keppra (itching), Zonisamide (itching), Dilantin  Diagnostic Data:  MRI brain with and without contrast done 06/2017: No acute changes. Hippocampi symmetric with no abnormal signal or enhancement seen. 1-hour wake and sleep EEG done 10/2017: Rare focal slowing over the right temporal region in sleep   Current Outpatient Medications on File Prior to Visit  Medication Sig Dispense Refill  . Etonogestrel (IMPLANON [place]) Inject into the skin.     No current facility-administered medications on file prior to visit.     Observations/Objective:   Vitals:   06/07/19 1044  Weight: 240 lb (108.9 kg)  Height: 5\' 3"  (1.6 m)   GEN:  The patient appears stated age and is in NAD. Tearful.  Neurological examination: Patient is awake, alert, oriented x 3. No aphasia or dysarthria. Intact fluency and comprehension. Remote and recent memory intact. Cranial nerves: Extraocular movements intact with no nystagmus. No facial asymmetry. Motor: moves all extremities symmetrically, at least anti-gravity x 4.    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 She describes a stereotyped sensation of dizziness, olfactory hallucination prior to the convulsions, may have left head turn with some of them. MRI brain normal, EEG showed rare right temporal focal slowing. Last GTC was 02/28/2019, however she reports episodes of unresponsiveness with rocking movements twice a week. She is having significant mood swings on the Briviact and would like to stop it, reduce to 1 tab qhs x 1 week, then stop. We discussed adding on Depakote ER 500mg  qhs, side effects discussed. Continue Vimpat 200mg  BID and Aptiom 1600mg  qhs. She continues to have seizures on full doses of 3 AEDs, now having side effects as well. We again discussed referral to a tertiary center for presurgical evaluation. She would like to proceed. She does not drive. Follow-up as scheduled next month, she knows to call for any changes.    Follow Up Instructions:   -I discussed the assessment and treatment plan with the patient. The patient was provided an opportunity to ask questions and all were answered. The patient agreed with the plan and demonstrated an understanding of the instructions.   The patient was advised to call back or seek an in-person evaluation if  the symptoms worsen or if the condition fails to improve as anticipated.     Cameron Sprang, MD

## 2019-06-07 NOTE — Telephone Encounter (Signed)
Patient is sch for a My Chart visit today at 3:00

## 2019-06-07 NOTE — Telephone Encounter (Signed)
I have a 3pm opening today 2/23, can she do a video or phone visit? Thanks

## 2019-06-07 NOTE — Addendum Note (Signed)
Addended by: Dimas Chyle on: 06/07/2019 04:24 PM   Modules accepted: Orders

## 2019-06-16 ENCOUNTER — Telehealth: Payer: Self-pay

## 2019-06-16 NOTE — Telephone Encounter (Signed)
Pt called informed that she need to have her MIR on a disc with her when she goes t her appointment at [facility]

## 2019-06-17 ENCOUNTER — Telehealth: Payer: Self-pay | Admitting: Neurology

## 2019-06-17 MED ORDER — DIVALPROEX SODIUM ER 500 MG PO TB24: ORAL_TABLET | ORAL | 11 refills | Status: DC

## 2019-06-17 NOTE — Telephone Encounter (Signed)
This morning pt had mild seizure watching tv wasn't like her normal seizures, then this afternoon she walked to the store was on the phone with her fiance and blacked out and had another seizure when her fiance got to her she was sitting on the ground talking and alert stated she did not hit her head her hands are scraped up. She has not missed her medications

## 2019-06-17 NOTE — Telephone Encounter (Signed)
Spoke to Wendy Stephenson, she is feeling better now. She had 2 small seizures today. Feels a little better off the Briviact. Instructed to increase Depakote 500mg : take 2 tabs qhs. Continue other meds.

## 2019-06-17 NOTE — Telephone Encounter (Signed)
How is she feeling off the Briviact? The Depakote is low dose, pls have her increase Depakote 500mg : take 2 tablets qhs starting tonight. Thanks

## 2019-06-17 NOTE — Telephone Encounter (Signed)
Debby Bud called regarding Wendy Stephenson and her possibly having 2 Seizure today. He said he would like to talk with Dr. Karel Jarvis. Thank you

## 2019-07-12 ENCOUNTER — Ambulatory Visit: Payer: Medicaid Other | Admitting: Neurology

## 2019-07-27 NOTE — Progress Notes (Signed)
briviact 50mg  and briviact 100mg  at front she didn't pick up returned to stock

## 2019-08-31 ENCOUNTER — Telehealth: Payer: Self-pay

## 2019-08-31 MED ORDER — DIVALPROEX SODIUM ER 500 MG PO TB24: ORAL_TABLET | ORAL | 11 refills | Status: DC

## 2019-08-31 NOTE — Addendum Note (Signed)
Addended by: Van Clines on: 08/31/2019 03:39 PM   Modules accepted: Orders

## 2019-08-31 NOTE — Telephone Encounter (Signed)
Patient family called to report seizure, no missed doses, not sleep deprived, no alcohol, it back to normal self. Fyi

## 2019-08-31 NOTE — Telephone Encounter (Signed)
Pt would like to increase meds.please advise

## 2019-08-31 NOTE — Telephone Encounter (Signed)
Pt advised.

## 2019-08-31 NOTE — Telephone Encounter (Signed)
Close encounter 

## 2019-08-31 NOTE — Telephone Encounter (Signed)
Pt would like to do this at bedtime 3 tablets, fyi

## 2019-08-31 NOTE — Telephone Encounter (Signed)
Pls let her know I sent in updated Rx for Depakote 500mg :Take 3 tabs qhs. Continue Vimpat and Aptiom as prescribed. Make sure she does not miss her appt at [facility], thanks!

## 2019-08-31 NOTE — Telephone Encounter (Signed)
How does she feel about increasing the Depakote to 3 tabs at bedtime? Or does she want to wait until her appointment at [facility] in June? Thanks

## 2019-09-05 ENCOUNTER — Telehealth: Payer: Self-pay | Admitting: Neurology

## 2019-09-16 ENCOUNTER — Telehealth: Payer: Self-pay | Admitting: Neurology

## 2019-09-16 NOTE — Telephone Encounter (Signed)
She wants to speak with someone about the new medication she has added. The medication makes her extremely drowsy and she has had 2 seizures since beginning the medication.

## 2019-09-16 NOTE — Telephone Encounter (Signed)
Pt called informed per Dr Karel Jarvis to go back down to Depakote 500mg  2 tabs in evening, take at dinner instead of bedtime and see if this helps with the drowsiness the next day. Pt verbalized understanding

## 2019-09-16 NOTE — Telephone Encounter (Signed)
Pt called stated that after adding that 3rd Depakote she is drowsy all the time she sleeps in late the next day, asking what can she do? Stated her [facility] appointment is June 14th,

## 2019-09-16 NOTE — Telephone Encounter (Signed)
Pls have her go back down to Depakote 500mg  2 tabs in evening, take at dinner instead of bedtime and see if this helps with the drowsiness the next day. Thanks

## 2019-10-11 ENCOUNTER — Ambulatory Visit: Payer: Medicaid Other | Admitting: Neurology

## 2019-10-14 ENCOUNTER — Telehealth: Payer: Self-pay | Admitting: Neurology

## 2019-10-14 NOTE — Telephone Encounter (Signed)
Spoke to [place] pt has increased Vimpat to 200mg  in AM, 300mg  in PM, he stated that pt will be at [facility] in 2 weeks for her appointment .

## 2019-10-14 NOTE — Telephone Encounter (Signed)
Pls let her know that I reviewed [facility] notes and it looks like Dr. Margret Chance told her to increase the Vimpat to 200mg  in AM, 300mg  in PM? Has she done this yet? Also, pls make sure she goes for the [facility] admission in 2 weeks. thanks

## 2019-10-14 NOTE — Telephone Encounter (Signed)
Patient's husband called in stating the patient had a seizure around 1:32 PM and it lasted around 2 minutes. Wanted [place] to be informed.

## 2019-10-20 IMAGING — MR MR HEAD WO/W CM
11 of 14 series · 34 of 48 positions shown · IV contrast (Yes)
Comparison: CT HEAD April 27, 2017

CLINICAL DATA: Two seizures within 1 week.

EXAM:
MRI HEAD WITHOUT AND WITH CONTRAST
TECHNIQUE: Multiplanar, multiecho pulse sequences of the brain and surrounding
structures were obtained without and with intravenous contrast.
CONTRAST:  20mL MULTIHANCE GADOBENATE DIMEGLUMINE 529 MG/ML IV SOLN

[Series 3: DWI · axial · 3.0mm · 1.09mm/px · z∈[-62,+90]mm · 8 of 104 slices shown (1 of 4)]
[im 1/104]
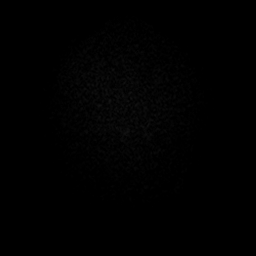
[im 12/104]
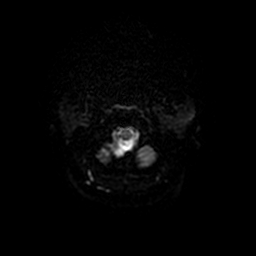
[im 35/104]
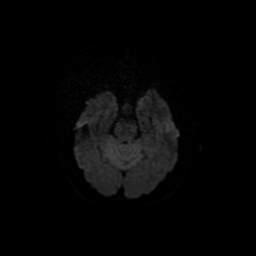
[im 46/104]
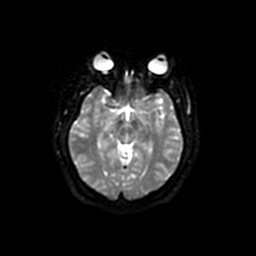
[im 58/104]
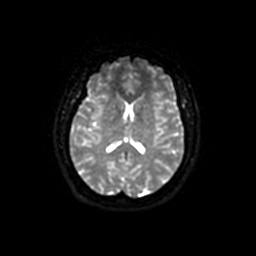
[im 69/104]
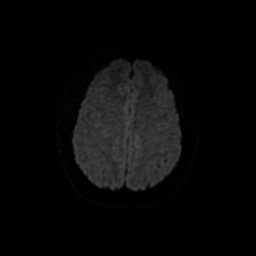
[im 92/104]
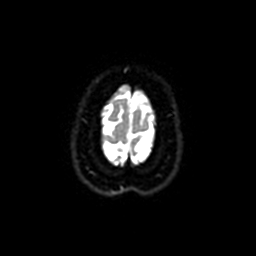
[im 104/104]
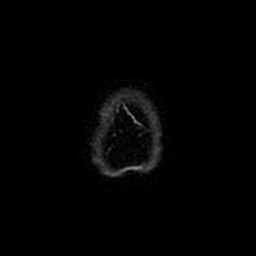

[Series 4: T1 · sagittal · 5.0mm · 0.47mm/px · 1 of 24 slices shown]
[im 1/24]
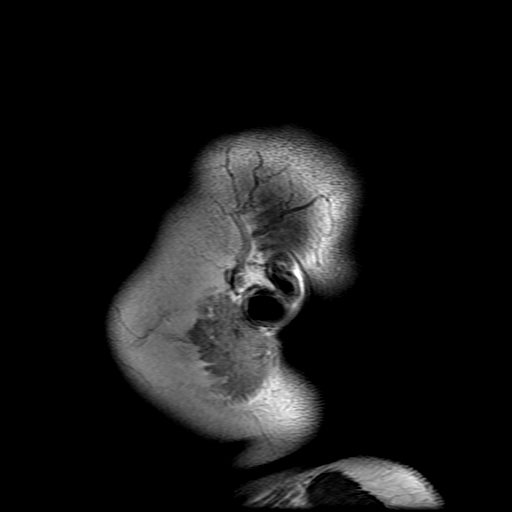

[Series 5: DWI · coronal · 5.0mm · 1.09mm/px · 6 of 68 slices shown (2 of 4)]
[im 1/68]
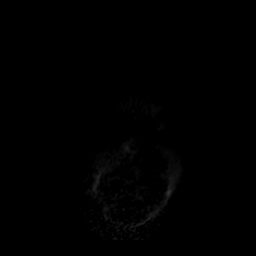
[im 14/68]
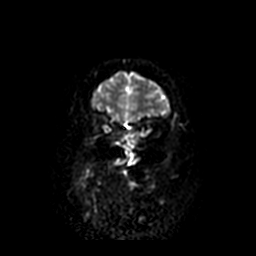
[im 27/68]
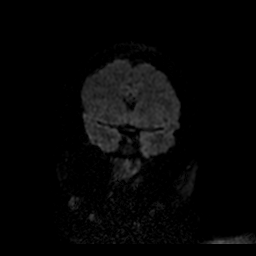
[im 41/68]
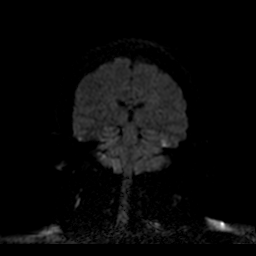
[im 54/68]
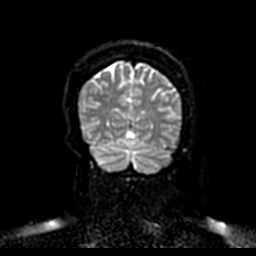
[im 68/68]
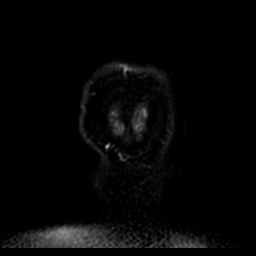

[Series 6: T2 · coronal · 3.0mm · 0.39mm/px · 2 of 28 slices shown (1 of 2)]
[im 1/28]
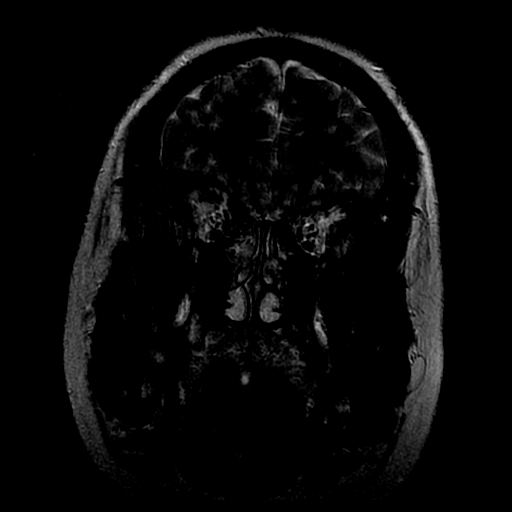
[im 28/28]
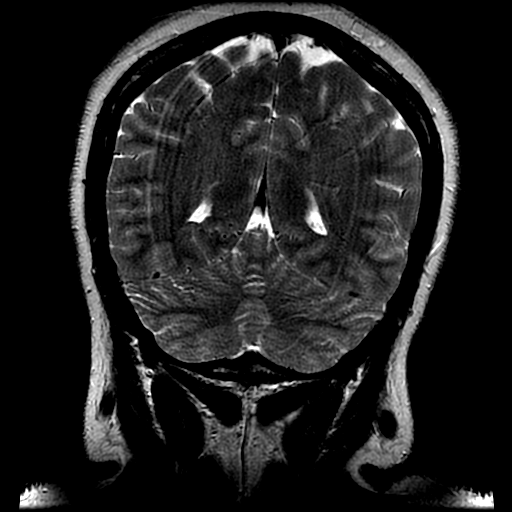

[Series 7: T2 · axial · 5.0mm · 0.43mm/px · z∈[-61,+93]mm · 2 of 27 slices shown (2 of 2)]
[im 1/27]
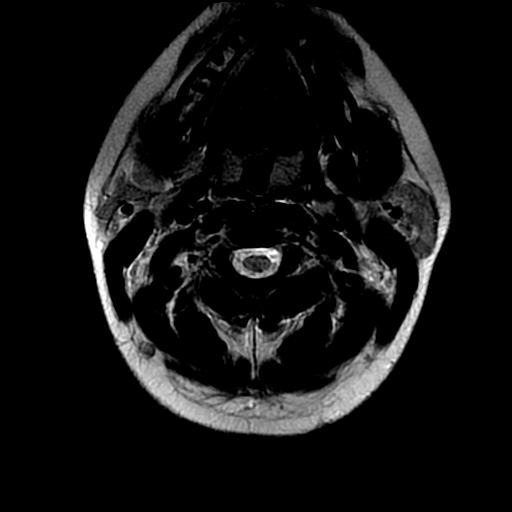
[im 27/27]
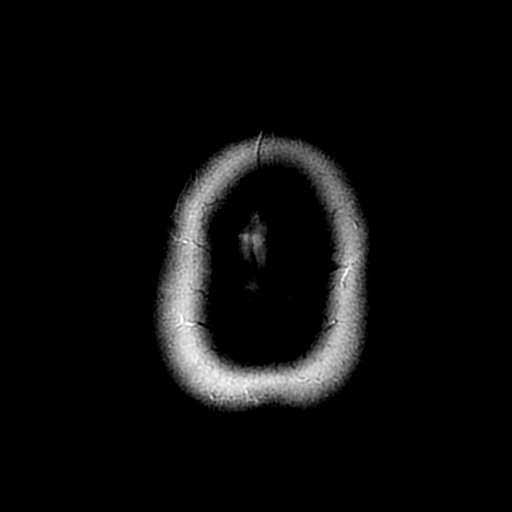

[Series 8: FLAIR · axial · 5.0mm · 0.43mm/px · z∈[-61,+93]mm · 2 of 27 slices shown]
[im 1/27]
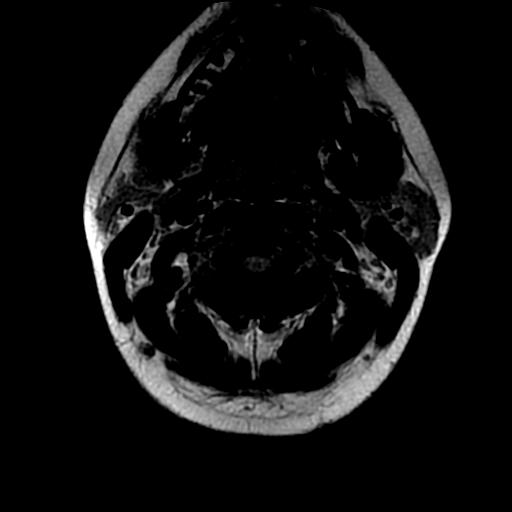
[im 27/27]
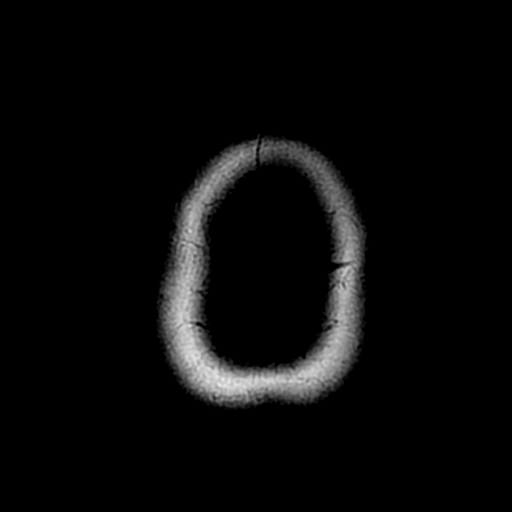

[Series 11: T2 post-contrast · coronal · 5.0mm · 0.45mm/px · 2 of 26 slices shown]
[im 1/26]
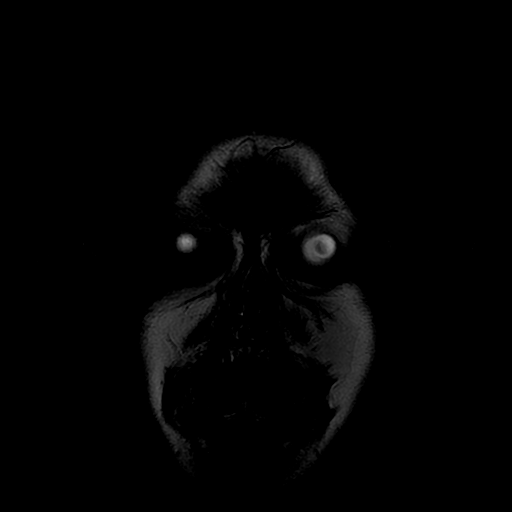
[im 26/26]
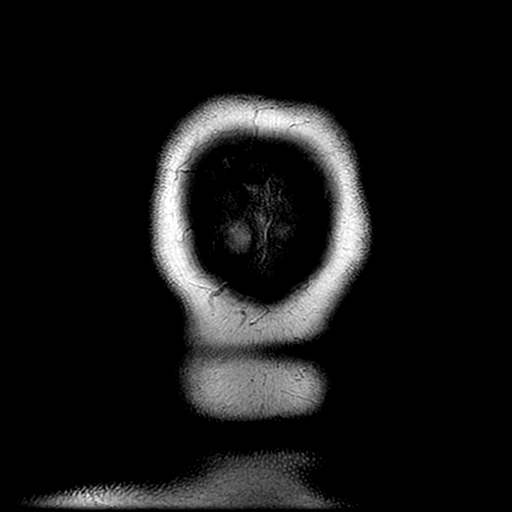

[Series 13: T1 post-contrast · coronal · 5.0mm · 0.45mm/px · 2 of 26 slices shown (1 of 2)]
[im 1/26]
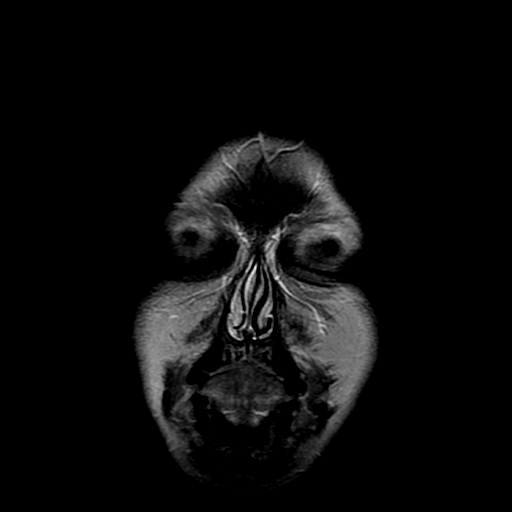
[im 26/26]
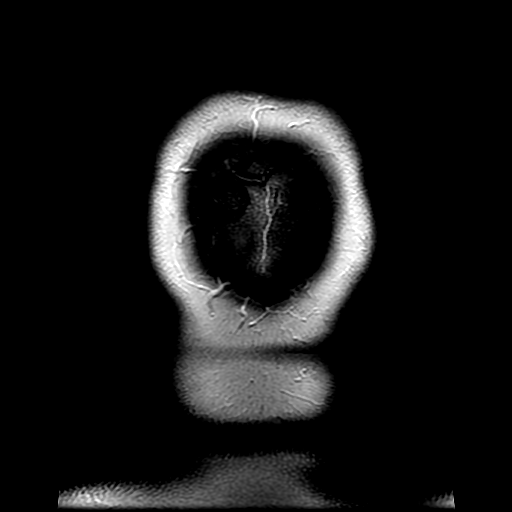

[Series 14: T1 post-contrast · sagittal · 5.0mm · 0.47mm/px · 2 of 24 slices shown (2 of 2)]
[im 1/24]
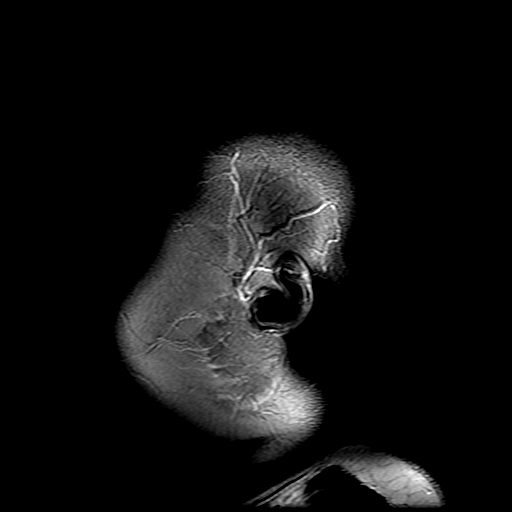
[im 24/24]
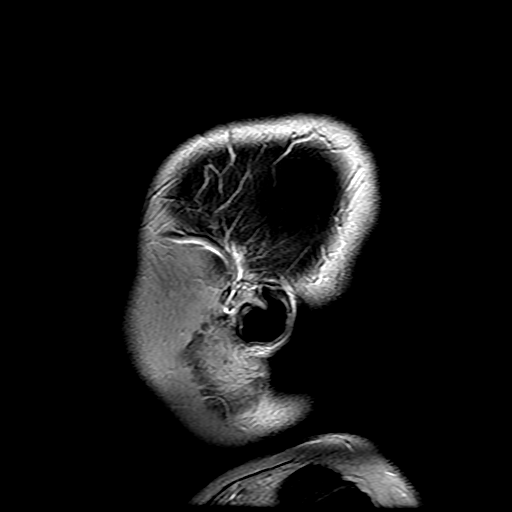

[Series 300: DWI · axial · 3.0mm · 1.09mm/px · z∈[-62,+90]mm · 4 of 52 slices shown (3 of 4)]
[im 1/52]
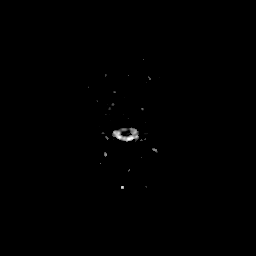
[im 18/52]
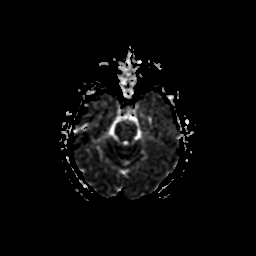
[im 35/52]
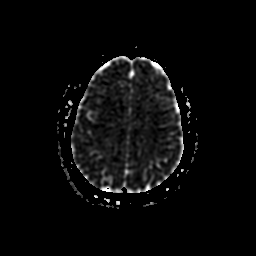
[im 52/52]
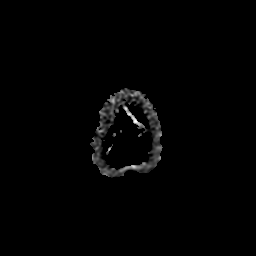

[Series 500: DWI · coronal · 5.0mm · 1.09mm/px · 3 of 34 slices shown (4 of 4)]
[im 1/34]
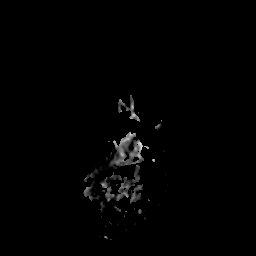
[im 17/34]
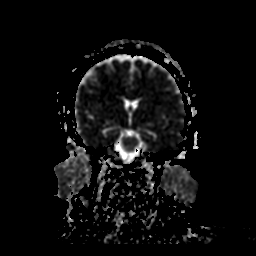
[im 34/34]
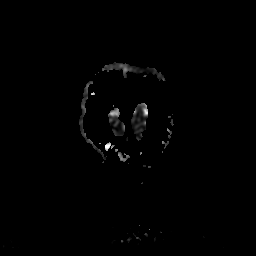

[34 of 48 positions shown; findings below may reference images not displayed]

FINDINGS: INTRACRANIAL CONTENTS: No reduced diffusion to suggest acute
ischemia or hyperacute demyelination. No susceptibility artifact to
suggest hemorrhage. The ventricles and sulci are normal for
patient's age. No suspicious parenchymal signal, masses, mass
effect. No abnormal intraparenchymal or extra-axial enhancement. No
abnormal extra-axial fluid collections. No extra-axial masses.
Motion degraded evaluation of the hippocampi with relatively
symmetric size and signal.

VASCULAR: Normal major intracranial vascular flow voids present at
skull base.

SKULL AND UPPER CERVICAL SPINE: No abnormal sellar expansion. No
suspicious calvarial bone marrow signal. Craniocervical junction
maintained.

SINUSES/ORBITS: Small RIGHT maxillary mucosal retention cysts
without paranasal sinus air-fluid levels. Mastoid air cells are well
aerated.The included ocular globes and orbital contents are
non-suspicious.

OTHER: None.
IMPRESSION: Normal MRI of the head with and without contrast.

## 2019-10-28 ENCOUNTER — Telehealth: Payer: Self-pay | Admitting: Neurology

## 2019-10-28 NOTE — Telephone Encounter (Signed)
Patient's boyfriend called to report an incident for patient. She had appointment at [facility] this past Wednesday, while they were waiting for a room they went to the cafeteria. In the cafeteria patient felt seizure coming on and notified boyfriend, he states he called for help and there were a lot of staff and other people in the area, but nobody came to help. He was trying to hold her up while she was having the seizure. He reports that she fell and smacked her face on the floor, broke a tooth that got lodged in her gum, had to have it removed and had to have stitches. Once she fell, that was when they received help. He is upset about what happened and wanted Dr. Karel Jarvis to be aware of the situation.

## 2019-11-07 ENCOUNTER — Telehealth: Payer: Self-pay

## 2019-11-07 NOTE — Telephone Encounter (Signed)
Pt called and advised to call [facility] to follow up on PA on medication, pt verbalized understanding

## 2019-11-07 NOTE — Telephone Encounter (Signed)
Patient was told by pharmacist to call Dr. Karel Jarvis concerning her prescription that's on hold. The issue is insurance related and patients need the meds asap, Pharmacy is [facility] on [address] Please call patient to advise.

## 2019-11-10 ENCOUNTER — Telehealth: Payer: Self-pay | Admitting: Neurology

## 2019-11-10 NOTE — Telephone Encounter (Signed)
Pt c/o: seizure Missed medications?  No. Sleep deprived?  No. Alcohol intake?  No. Back to their usual baseline self?  Yes.  . If no, advise go to ER Current medications prescribed by Dr. Karel Jarvis: Depakote 500 mg 3 tabs @ HS, Aptiom 800 mg 2 tabs daily, Vimpat 200 mg 1 tab BID and [facility] added Lamictal 100 mg q 12 hrs  Pt had a seizure July 14th while in the cafeteria at [facility] waiting for her room to be ready, had a slight one in her sleep while having her EEG monitoring at [facility], today is the first one since discharge from [place]. today's seizure happened while she was sleeping, lasted ~2 minutes per husband.

## 2019-11-10 NOTE — Telephone Encounter (Signed)
Patient's husband Debby Bud called in to tell Dr. Karel Jarvis the patient just had a seizure. It lasted about 2 minutes.

## 2019-11-11 MED ORDER — CLOBAZAM 20 MG PO TABS: ORAL_TABLET | ORAL | 5 refills | Status: DC

## 2019-11-11 NOTE — Telephone Encounter (Signed)
Spoke to patient and boyfriend, she had a nocturnal seizure yesterday. Discussed notes from [facility] discharge summary, they state they have not received the Onfi and did not get any discharge instructions. Per notes, start Onfi 10mg  daily for a week, then increase to 20mg  daily. When she starts the Onfi, reduce Aptiom to 1 tab daily for 2 weeks, then stop. Continue all other medications until f/u with Dr. at [facility] in September. Side effects discussed.

## 2019-12-12 ENCOUNTER — Other Ambulatory Visit: Payer: Self-pay | Admitting: Neurology

## 2019-12-14 ENCOUNTER — Ambulatory Visit (HOSPITAL_COMMUNITY)
Admission: EM | Admit: 2019-12-14 | Discharge: 2019-12-14 | Disposition: A | Discharge status: Home / Self Care | Payer: Medicaid Other

## 2019-12-14 ENCOUNTER — Other Ambulatory Visit: Payer: Self-pay

## 2019-12-14 NOTE — ED Triage Notes (Signed)
Pt is here wanting her 3 suture removed but was seen by Dahlia Byes, NP , she confirmed they was no infection. Pt had these suture put in at [facility].

## 2019-12-14 NOTE — ED Triage Notes (Addendum)
Pt here for wound check and suture removal. Pt had 5 sutures placed to upper right lip area on July 14 at [facility] s/p seizure which caused pt's teeth puncture lip. Sutures intact. Pt denies drainage from site, fever.

## 2019-12-27 ENCOUNTER — Other Ambulatory Visit: Payer: Self-pay | Admitting: Neurology

## 2019-12-27 MED ORDER — LACOSAMIDE 200 MG PO TABS: 200.0000 mg | ORAL_TABLET | Freq: Two times a day (BID) | ORAL | 5 refills | Status: DC

## 2019-12-27 NOTE — Telephone Encounter (Signed)
Patient needs a refill on the Vimpat sent to the [facility] on [address]

## 2020-01-26 ENCOUNTER — Telehealth: Payer: Self-pay | Admitting: Neurology

## 2020-01-26 NOTE — Telephone Encounter (Signed)
Pt c/o: seizure Missed medications?  No. Sleep deprived?  No. Alcohol intake?  No. Back to their usual baseline self?  Yes.  . If no, advise go to ER Current medications prescribed by Dr. Karel Jarvis:  ONFI 20mg  1 tab at night  depakote 500mg  3 tab at night  vimpat 200mg  1 tab BID lamictal 100mg  1 tab BID  No increase in stress, pt was watching TV when she had her seizure, she called her fiance name when he got to her she started having her seizure

## 2020-01-26 NOTE — Telephone Encounter (Signed)
Patient's emergency contact called to notify Dr Karel Jarvis that patient just had a seizure. She still has a little bit of a headache.

## 2020-01-27 NOTE — Telephone Encounter (Signed)
Spoke with Wendy Stephenson he said he called Dr Margret Chance 1st they havent called him back yet. He was advised to called Dr Margret Chance back if he hasnt heard anything from them after lunch , he stated he would call their office back to see what medication changed they wanted to make for Wendy Stephenson

## 2020-01-27 NOTE — Telephone Encounter (Signed)
Pls let Debby Bud know I have reviewed Dr. Loletha Carrow notes (from [facility]), he saw her 2 weeks ago and it looks like he has plans for medication changes for continued seizures. Pls have him call Dr. Loletha Carrow office about recent seizure. Thanks

## 2020-01-27 NOTE — Telephone Encounter (Signed)
Called and left a message to call the office back.

## 2020-02-10 ENCOUNTER — Ambulatory Visit: Payer: Medicaid Other | Admitting: Neurology

## 2020-03-30 ENCOUNTER — Telehealth: Payer: Self-pay | Admitting: Neurology

## 2020-03-30 MED ORDER — CLOBAZAM 20 MG PO TABS: ORAL_TABLET | ORAL | 5 refills | Status: DC

## 2020-03-30 NOTE — Telephone Encounter (Signed)
Pls confirm how she is taking the clobazam, is she taking 20mg  qhs? If yes, increase to 10mg  in AM, 20mg  in PM. I agree that at this point with being on 4 seizure medications and continued seizures, we need to think of other options aside from medications. I reviewed Dr. note at [facility] and he has discussed other options with her such as surgery and devices. Please have her increase dose of Clobazam and make follow-up appt with Dr. to discuss other options aside from medications. Thanks

## 2020-03-30 NOTE — Telephone Encounter (Signed)
Patient called to report she had a seizure las night. She said, "It seems like nothing seems to be working to control my seizures, I'm tired of it. I'd like a call back with some recommendations."

## 2020-03-30 NOTE — Addendum Note (Signed)
Addended by: Van Clines on: 03/30/2020 04:28 PM   Modules accepted: Orders

## 2020-03-30 NOTE — Telephone Encounter (Signed)
Send script for clobazam  to [id number] - [place], [place] - [address]

## 2020-03-30 NOTE — Telephone Encounter (Signed)
Correction: patient has been taking clobazam 20mg  1/2 tab qhs because she only gets 15 tabs every month. Rx updated to take 1 tab qhs, #30 with 5 refills.

## 2020-03-30 NOTE — Telephone Encounter (Signed)
Patient reports she only gets 15 tabs of Onfi 10mg  a month so she has been cutting them in half. Rx for Onfi 10mg  qhs #30 with refills will be sent. Can increase dose if needed.

## 2020-04-19 ENCOUNTER — Telehealth: Payer: Self-pay | Admitting: Neurology

## 2020-04-19 NOTE — Telephone Encounter (Signed)
Patient called and said, "The dosage is incorrect in her clobazam. She said, "I am to be taking a full pill now but the pharmacy does not have the new prescription. I am out of the medication now and I've been trying to get this resolved since last week."  [facility] on [address]

## 2020-04-19 NOTE — Telephone Encounter (Signed)
Please see note.

## 2020-04-20 NOTE — Telephone Encounter (Signed)
Can you pls confirm with pharmacy, because the last Rx we sent on 12/17 was clobazam 20mg : take 1 tablet every night. Thanks

## 2020-04-20 NOTE — Telephone Encounter (Signed)
Pharmacy contacted filled today with dose of onfi 20mg  and it was too early too fill, submitted today and patient notified to contact pharmacy for Rx pick up.

## 2020-05-02 ENCOUNTER — Telehealth: Payer: Self-pay | Admitting: Neurology

## 2020-05-02 NOTE — Telephone Encounter (Signed)
Pls order Lamictal level and Depakote level (she should have it done first thing in the morning before she takes her morning pills). Pls let them know which lab to go. Pls have her f/u at [facility] as scheduled this month, then with me after. Thanks

## 2020-05-02 NOTE — Telephone Encounter (Signed)
Patient's spouse called and said, "I just want Dr. Karel Jarvis to know Wendy Stephenson had a seizure that lasted about 3 minutes today. It was longer than usual and I just wanted the doctor to know."

## 2020-05-02 NOTE — Telephone Encounter (Signed)
LMOVM, Please give [place] a call back so we can send labs or have pt come by our lab to have blood drawn.

## 2020-05-14 ENCOUNTER — Other Ambulatory Visit: Payer: Self-pay

## 2020-05-14 ENCOUNTER — Encounter: Payer: Self-pay | Admitting: Neurology

## 2020-05-14 ENCOUNTER — Telehealth (INDEPENDENT_AMBULATORY_CARE_PROVIDER_SITE_OTHER): Payer: Medicaid Other | Admitting: Neurology

## 2020-05-14 VITALS — Ht 61.0 in | Wt 248.0 lb

## 2020-05-14 DIAGNOSIS — G40019 Localization-related (focal) (partial) idiopathic epilepsy and epileptic syndromes with seizures of localized onset, intractable, without status epilepticus: Secondary | ICD-10-CM

## 2020-05-14 DIAGNOSIS — R400 Somnolence: Secondary | ICD-10-CM

## 2020-05-14 MED ORDER — DIVALPROEX SODIUM ER 500 MG PO TB24: ORAL_TABLET | ORAL | 11 refills | Status: DC

## 2020-05-14 MED ORDER — LACOSAMIDE 200 MG PO TABS: 200.0000 mg | ORAL_TABLET | Freq: Two times a day (BID) | ORAL | 5 refills | Status: DC

## 2020-05-14 MED ORDER — LAMOTRIGINE 100 MG PO TABS
100.0000 mg | ORAL_TABLET | Freq: Two times a day (BID) | ORAL | 11 refills | Status: DC
Start: 2020-05-14 — End: 2022-04-29

## 2020-05-14 NOTE — Progress Notes (Signed)
Virtual Visit via Video Note The purpose of this virtual visit is to provide medical care while limiting exposure to the novel coronavirus.    Consent was obtained for video visit:  Yes.   Answered questions that patient had about telehealth interaction:  Yes.   I discussed the limitations, risks, security and privacy concerns of performing an evaluation and management service by telemedicine. I also discussed with the patient that there may be a patient responsible charge related to this service. The patient expressed understanding and agreed to proceed.  Pt location: Home Physician Location: office Name of referring provider:  [facility] I connected with RHAELYN GIRON at patients initiation/request on 05/14/2020 at  3:00 PM EST by video enabled telemedicine application and verified that I am speaking with the correct person using two identifiers. Pt MRN:  [id number] Pt DOB:  11-Mar-1991 Video Participants:  [name]   History of Present Illness:  The patient had a virtual video visit on 05/14/2020. 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 Since her last visit, she has been evaluated at the [facility] clinic by Dr. Margret Chance and underwent vEEG monitoring in July 2021. She had one nocturnal seizure with subtle bicycling leg movements, right arm stretched out, with nonlateralizing EEG. Interictal EEG showed rare right temporal sharp waves. She did not want to proceed with surgical evaluation, Aptiom was switched to Clobazam. She is currently on Clobazam 20mg  qhs, Lamotrigine 100mg  BID, Vimpat 200mg  BID, and Depakote ER 1500mg  qhs. Her significant other calls our office when she has a seizure, he called in July, December, and most recently 05/03/2019. She is not sure if they occur around the time of her menstrual period. She usually gets dizzy and anxious prior to a seizure. She has rare headaches, and had one a couple of days ago. She reports poor sleep,  she sleeps late, but would sleep during the day, taking her morning medication and getting her son ready in the morning, then sleeping until he comes back from school, then taking another nap in the evening. She reports feeling tired on her medications, and wanting to eat more/weight gain. No current pregnancy plans. She does not drive.    History on Initial Assessment 10/29/2017: This is a pleasant 30 year old right-handed woman with no significant past medical history, in her usual state of health until 04/27/17 when she recalls feeling warm, she went to turn on the fan and then to the bathroom, then her boyfriend heard a fall and saw her having a full body convulsion lasting 2 minutes. She was confused after, no focal weakness. She bit her tongue, no incontinence. Bloodwork showed an elevated WBC of 19.1, normal BMP. I personally reviewed CT head without contrast which was normal. She had another episode while sleeping on 07/09/17. She woke up feeling odd, dizzy like she would pass out. She sat up then started having a convulsion lasting 2 minutes. She was again brought to the ER, WBC was 20, BMP normal. I personally reviewed MRI brain with and without contrast which was normal, hippocampi symmetric with no abnormal signal or enhancement seen. She was discharged home on Keppra 500mg  BID but had itching that was not relieved with Benadryl, so she stopped the medication. She was back in the ER on 08/21/17 with another convulsion and was discharged home on Dilantin. She was back on 10/01/17 with another seizure out of sleep, she woke up feeling hot, tried  to go back to sleep, then woke up in the ambulance. Her boyfriend again noted a convulsive seizure. Dilantin level was undetectable. Her last seizure was on 10/01/17, she took a nap at noon and woke up feeling lightheaded like she would pass out, her boyfriend assisted her to the commode and she had a convulsion with tongue bite. Seizure lasted 2 minutes. Her Dilantin  level was 7.3. She was discharged home on Dilantin 400mg  qhs. Her boyfriend reports some seizures are associated with head turn to the left side. She has occasional episodes where she feels she will have a seizure with the dizziness occurring 1-2 times a month, but it does not progress. Last episode was 2 days ago. Her boyfriend feels she would stare off after taking the Dilantin, it takes her a while to respond. She denies any gaps in time, olfactory/gustatory hallucinations, deja vu, rising epigastric sensation, focal numbness/tingling/weakness. She has occasional body jerks. She feels weird on the Dilantin, but denies any dizziness, diplopia, gait instability. She denies any headaches, neck/back pain, bowel/bladder dysfunction.   Epilepsy Risk Factors:  Her paternal uncle had epilepsy. Otherwise she had a normal birth and early development.  There is no history of febrile convulsions, CNS infections such as meningitis/encephalitis, significant traumatic brain injury, neurosurgical procedures.  Prior AEDs: Keppra (itching), Zonisamide (itching), Dilantin, Aptiom, Briviact (mood swings)  Diagnostic Data:  MRI brain with and without contrast done 06/2017: No acute changes. Hippocampi symmetric with no abnormal signal or enhancement seen. 1-hour wake and sleep EEG done 10/2017: Rare focal slowing over the right temporal region in sleep Video EEG [facility] EMU admission (10/26/19-11/01/19): One episode on 10/29/19 0210. Patient shuffling in bed, subtle bicycling leg movements, right arm stretches out, adjusts covers and goes back to sleep. Nonlateralizable theta rhythm develops over bitemporal regions and spreads quickly over bilateral head regions. Interictal EEG: Rare R temporal sharp waves     Current Outpatient Medications on File Prior to Visit  Medication Sig Dispense Refill  . cloBAZam (ONFI) 20 MG tablet Take 1 tablet every night 30 tablet 5  . divalproex (DEPAKOTE ER) 500 MG 24 hr tablet Take 3  tablets every night 90 tablet 11  . Etonogestrel (IMPLANON [place]) Inject into the skin.    10/31/19 lacosamide (VIMPAT) 200 MG TABS tablet Take 1 tablet (200 mg total) by mouth 2 (two) times daily. 60 tablet 5  . lamoTRIgine (LAMICTAL) 100 MG tablet Take 1 tablet by mouth 2 times daily at 12 noon and 4 pm.     No current facility-administered medications on file prior to visit.     Observations/Objective:   Vitals:   05/14/20 1125  Weight: 248 lb (112.5 kg)  Height: 5\' 1"  (1.549 m)   GEN:  The patient appears stated age and is in NAD.  Neurological examination: Patient is awake, alert. No aphasia or dysarthria. Intact fluency and comprehension.  Cranial nerves: Extraocular movements intact with no nystagmus. No facial asymmetry. Motor: moves all extremities symmetrically, at least anti-gravity x 4. No incoordination on finger to nose testing. Gait: narrow-based and steady, able to tandem walk adequately. Negative Romberg test.   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 She had video EEG monitoring in July 2021 at [facility] which captured only one seizure that was nonlateralizing. She is now on clobazam 20mg  qhs, Depakote 1500mg  qhs, Lamotrigine 100mg  BID, and Vimpat 200mg  BID and  reports weight gain and some fatigue. Last seizure was on 05/02/2020. She continues to have seizure on this regimen, but less than prior to initiation of clobazam. She will be seeing Dr. Margret Chance in 2 weeks, we discussed getting AED levels for his review, they had previously discussed reducing Depakote. She will discuss medication changes on follow-up with him, continue current AEDs until then. She is not interested in surgery at this time. She is also reporting daytime drowsiness, home sleep study will be ordered to assess for sleep apnea. She does not drive. No current pregnancy plans. Follow-up in 6 months, she knows to call  for any changes.    Follow Up Instructions:   -I discussed the assessment and treatment plan with the patient. The patient was provided an opportunity to ask questions and all were answered. The patient agreed with the plan and demonstrated an understanding of the instructions.   The patient was advised to call back or seek an in-person evaluation if the symptoms worsen or if the condition fails to improve as anticipated.    Van Clines, MD

## 2020-06-07 ENCOUNTER — Telehealth: Payer: Self-pay | Admitting: Neurology

## 2020-06-07 NOTE — Telephone Encounter (Signed)
Patient picked up her Lamotrigine a couple of weeks ago and she just realized that her bottle says 100 mg but states that the last prescription was for 200mg  so she wants to make sure she is taking the right dosage. Please call.

## 2020-06-08 NOTE — Telephone Encounter (Signed)
Pt called and informed that we have on record as well as [facility] has her taking, Vimpat (Lacosamide) 200mg  twice a day, and Lamotrigine 100mg  twice a day. Pt verbalized understanding and checked her pill bottles

## 2020-06-08 NOTE — Telephone Encounter (Signed)
That is correct, pls let her know that is also what [facility] has her taking, Vimpat (Lacosamide) 200mg  twice a day, and Lamotrigine 100mg  twice a day. Thanks.

## 2020-07-06 ENCOUNTER — Telehealth: Payer: Self-pay | Admitting: Neurology

## 2020-07-06 NOTE — Telephone Encounter (Signed)
Would stay on same medication for now, we can increase clobazam in the future. Thanks

## 2020-07-06 NOTE — Telephone Encounter (Signed)
Patient's spouse called report the patient had a seizure this morning. They'd like a call back from a nurse.

## 2020-07-06 NOTE — Telephone Encounter (Signed)
Spoke with Debby Bud and advised him that per Dr Perlie Mayo should  stay on same medication for now, we can increase clobazam in the future

## 2020-07-06 NOTE — Telephone Encounter (Signed)
Pt c/o: seizure Missed medications?  no Sleep deprived?  No. Alcohol intake?  No. Back to their usual baseline self?  Yes.  . If no, advise go to ER. Pt is resting at this time,  No increase in stress  Current medications prescribed by Dr. Karel Jarvis:  Lamotrigine 100 mg 1 tablet by mouth 2 times daily  Vimpat 200 mg 1 tab BID  Divalproex 500mg  3 tablets every night  Clobazam 20 mg 1 tab every night

## 2020-08-07 ENCOUNTER — Telehealth: Payer: Self-pay | Admitting: Neurology

## 2020-08-07 NOTE — Telephone Encounter (Signed)
He really has to call Dr. Margret Chance at [facility] about this, she cannot have too many cooks in the kitchen, I will follow along but at this point we will follow medication changes recommended at [facility]. Thanks

## 2020-08-07 NOTE — Telephone Encounter (Signed)
Patient's significant other called in stating the patient just had another seizure that lasted around 5 minutes, which is longer than her usual seizures. He stated she has been taking her Clobizam differently. Another provider prescribed it to her about a month ago and she is taking 10 mg in the morning and 20 mg at night.

## 2020-08-07 NOTE — Telephone Encounter (Signed)
Spoke with Debby Bud informed him to call Dr. Margret Chance at [facility] about this, she cannot have too many cooks in the kitchen, Dr Karel Jarvis will follow along but at this point we will follow medication changes recommended at [facility]. He stated that he will call [facility]

## 2021-04-02 ENCOUNTER — Ambulatory Visit: Payer: Medicaid Other | Admitting: Neurology

## 2021-04-10 ENCOUNTER — Telehealth (INDEPENDENT_AMBULATORY_CARE_PROVIDER_SITE_OTHER): Payer: Medicaid Other | Admitting: Neurology

## 2021-04-10 ENCOUNTER — Other Ambulatory Visit: Payer: Self-pay

## 2021-04-10 ENCOUNTER — Encounter: Payer: Self-pay | Admitting: Neurology

## 2021-04-10 VITALS — Ht 62.0 in | Wt 237.0 lb

## 2021-04-10 DIAGNOSIS — G40019 Localization-related (focal) (partial) idiopathic epilepsy and epileptic syndromes with seizures of localized onset, intractable, without status epilepticus: Secondary | ICD-10-CM | POA: Diagnosis not present

## 2021-04-10 MED ORDER — FYCOMPA 2 MG PO TABS: 2.0000 mg | ORAL_TABLET | Freq: Every day | ORAL | 5 refills | Status: DC

## 2021-04-10 NOTE — Progress Notes (Signed)
Virtual Visit via Video Note The purpose of this virtual visit is to provide medical care while limiting exposure to the novel coronavirus.    Consent was obtained for video visit:  Yes.   Answered questions that patient had about telehealth interaction:  Yes.   I discussed the limitations, risks, security and privacy concerns of performing an evaluation and management service by telemedicine. I also discussed with the patient that there may be a patient responsible charge related to this service. The patient expressed understanding and agreed to proceed.  Pt location: Home Physician Location: office Name of referring provider:  [facility] I connected with Wendy Stephenson at patients initiation/request on 04/10/2021 at  3:00 PM EST by video enabled telemedicine application and verified that I am speaking with the correct person using two identifiers. Pt MRN:  [id number] Pt DOB:  [birth year] Video Participants:  Wendy Stephenson;  Wendy Stephenson (significant other)   History of Present Illness:  The patient had a virtual video visit on 04/10/2021. 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 In the interim, she continues to follow-up with Dr. Margret Chance with [facility]. She was supposed to have repeat EMU admission this month but had to cancel due to childcare issues. She is rescheduled for February. Her last seizure was in 12/20, she had 2 in her sleep that day. Prior to this, seizure was on 11/2. She has 1-2 seizures a month, longest seizure-free interval of 2.5 months. She was reporting sedation on 5 ASMs to Dr. Margret Chance, notes indicate he would like to check levels, she has not done it yet. She is currently on clobazam 20mg  qhs, Depakote ER 1500mg  qhs, Fycompa 2mg  qhs, Lacosamide 200mg  BID, and Lamotrigine 100mg  BID. She states she is not drowsy all the time. She sleeps well at night. She denies any headaches, dizziness, no falls.   She had an MRI  brain with and without contrast at [facility] on 03/06/21 reporting subtle increased FLAIR signal in the right anteromedial temporal lobe, including amygdala, hippocampus, parahippocampal gyrus, which may reflect a combination of post-ictal cellular edema and/or gliosis. In retrospect, this area of signal hyperintensity may also correlate to a subtle area of interictal hypometabolism in the anterior right temporal lobe on recent PET CT.  History on Initial Assessment 10/29/2017: This is a pleasant 30 year old right-handed woman with no significant past medical history, in her usual state of health until 04/27/17 when she recalls feeling warm, she went to turn on the fan and then to the bathroom, then her boyfriend heard a fall and saw her having a full body convulsion lasting 2 minutes. She was confused after, no focal weakness. She bit her tongue, no incontinence. Bloodwork showed an elevated WBC of 19.1, normal BMP. I personally reviewed CT head without contrast which was normal. She had another episode while sleeping on 07/09/17. She woke up feeling odd, dizzy like she would pass out. She sat up then started having a convulsion lasting 2 minutes. She was again brought to the ER, WBC was 20, BMP normal. I personally reviewed MRI brain with and without contrast which was normal, hippocampi symmetric with no abnormal signal or enhancement seen. She was discharged home on Keppra 500mg  BID but had itching that was not relieved with Benadryl, so she stopped the medication. She was back in the ER on 08/21/17 with another convulsion and was discharged home on Dilantin. She was back on 10/01/17  with another seizure out of sleep, she woke up feeling hot, tried to go back to sleep, then woke up in the ambulance. Her boyfriend again noted a convulsive seizure. Dilantin level was undetectable. Her last seizure was on 10/01/17, she took a nap at noon and woke up feeling lightheaded like she would pass out, her boyfriend assisted her to  the commode and she had a convulsion with tongue bite. Seizure lasted 2 minutes. Her Dilantin level was 7.3. She was discharged home on Dilantin 400mg  qhs. Her boyfriend reports some seizures are associated with head turn to the left side. She has occasional episodes where she feels she will have a seizure with the dizziness occurring 1-2 times a month, but it does not progress. Last episode was 2 days ago. Her boyfriend feels she would stare off after taking the Dilantin, it takes her a while to respond. She denies any gaps in time, olfactory/gustatory hallucinations, deja vu, rising epigastric sensation, focal numbness/tingling/weakness. She has occasional body jerks. She feels weird on the Dilantin, but denies any dizziness, diplopia, gait instability. She denies any headaches, neck/back pain, bowel/bladder dysfunction.    Epilepsy Risk Factors:  Her paternal uncle had epilepsy. Otherwise she had a normal birth and early development.  There is no history of febrile convulsions, CNS infections such as meningitis/encephalitis, significant traumatic brain injury, neurosurgical procedures.   Prior AEDs: Keppra (itching), Zonisamide (itching), Dilantin, Aptiom, Briviact (mood swings)  Diagnostic Data:  MRI brain with and without contrast done 06/2017: No acute changes. Hippocampi symmetric with no abnormal signal or enhancement seen. 1-hour wake and sleep EEG done 10/2017: Rare focal slowing over the right temporal region in sleep Video EEG [facility] admission (10/26/19-11/01/19): One episode on 10/29/19 0210. Patient shuffling in bed, subtle bicycling leg movements, right arm stretches out, adjusts covers and goes back to sleep. Nonlateralizable theta rhythm develops over bitemporal regions and spreads quickly over bilateral head regions. Interictal EEG: Rare R temporal sharp waves    Current Outpatient Medications on File Prior to Visit  Medication Sig Dispense Refill   cloBAZam (ONFI) 20 MG tablet Take 1  tablet every night 30 tablet 5   divalproex (DEPAKOTE ER) 500 MG 24 hr tablet Take 3 tablets every night 90 tablet 11   Etonogestrel (IMPLANON [place]) Inject into the skin.     FYCOMPA 2 MG tablet Take 2 mg by mouth at bedtime.     lacosamide (VIMPAT) 200 MG TABS tablet Take 1 tablet (200 mg total) by mouth 2 (two) times daily. 60 tablet 5   lamoTRIgine (LAMICTAL) 100 MG tablet Take 1 tablet (100 mg total) by mouth 2 times daily at 12 noon and 4 pm. 60 tablet 11   No current facility-administered medications on file prior to visit.     Observations/Objective:   Vitals:   04/10/21 1320  Weight: 237 lb (107.5 kg)  Height: 5\' 2"  (1.575 m)   GEN:  The patient appears stated age and is in NAD. She is awake, alert No aphasia or dysarthria. Intact fluency and comprehension. Cranial nerves: Extraocular movements intact with no nystagmus. No facial asymmetry. Motor: moves all extremities symmetrically, at least anti-gravity x 4.    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 She had video EEG monitoring in July 2021 at [facility] which captured only one seizure that was nonlateralizing. Recent MRI brain showed subtle increased FLAIR signal  in the right anteromedial temporal lobe. She is on 5 ASMs, notes from [facility] reviewed, Dr. Margret Chance would like to check levels, we can get that done locally so she has it available for their review. She is scheduled for repeat EMU admission in February 2023. We agreed to continue on current regimen of Clobazam 20mg  qhs, Depakote 1500mg  qhs, Lamotrigine 100mg  BID, Vimpat 200mg  BID, and Fycompa 2mg  qhs. Last seizure was 04/02/21. She is not driving. Continue follow-up with [facility]. Follow-up in 6 months, I am happy to help locally as much as possible, but would defer management to Dr. for now.    Follow Up Instructions:   -I discussed the assessment and  treatment plan with the patient. The patient was provided an opportunity to ask questions and all were answered. The patient agreed with the plan and demonstrated an understanding of the instructions.   The patient was advised to call back or seek an in-person evaluation if the symptoms worsen or if the condition fails to improve as anticipated.    , MD

## 2021-04-10 NOTE — Patient Instructions (Signed)
Good to see you!  Continue all your medications  2. Have bloodwork done first thing in the morning for Lamictal level, Lacosamide level, clobazam level, Depakote level.   3. Follow-up with Dr. Margret Chance as scheduled, follow-up with me in 6 months   Seizure Precautions: 1. If medication has been prescribed for you to prevent seizures, take it exactly as directed.  Do not stop taking the medicine without talking to your doctor first, even if you have not had a seizure in a long time.   2. Avoid activities in which a seizure would cause danger to yourself or to others.  Don't operate dangerous machinery, swim alone, or climb in high or dangerous places, such as on ladders, roofs, or girders.  Do not drive unless your doctor says you may.  3. If you have any warning that you may have a seizure, lay down in a safe place where you can't hurt yourself.    4.  No driving for 6 months from last seizure, as per [facility].   Please refer to the following link on the Epile[employer] Foundation of America's website for more information: http://www.epilepsyfoundation.org/answerplace/Social/driving/drivingu.cfm   5.  Maintain good sleep hygiene. Avoid alcohol.  6.  Notify your neurology if you are planning pregnancy or if you become pregnant.  7.  Contact your doctor if you have any problems that may be related to the medicine you are taking.  8.  Call 911 and bring the patient back to the ED if:        A.  The seizure lasts longer than 5 minutes.       B.  The patient doesn't awaken shortly after the seizure  C.  The patient has new problems such as difficulty seeing, speaking or moving  D.  The patient was injured during the seizure  E.  The patient has a temperature over 102 F (39C)  F.  The patient vomited and now is having trouble breathing

## 2021-04-11 NOTE — Addendum Note (Signed)
Addended by: Dimas Chyle on: 04/11/2021 07:43 AM   Modules accepted: Orders

## 2021-09-13 ENCOUNTER — Ambulatory Visit: Payer: Medicaid Other | Admitting: Neurology

## 2021-11-12 ENCOUNTER — Encounter: Payer: Self-pay | Admitting: Neurology

## 2021-11-12 ENCOUNTER — Telehealth (INDEPENDENT_AMBULATORY_CARE_PROVIDER_SITE_OTHER): Payer: Medicaid Other | Admitting: Neurology

## 2021-11-12 VITALS — Ht 63.0 in | Wt 243.0 lb

## 2021-11-12 DIAGNOSIS — G40019 Localization-related (focal) (partial) idiopathic epilepsy and epileptic syndromes with seizures of localized onset, intractable, without status epilepticus: Secondary | ICD-10-CM

## 2021-11-12 MED ORDER — FYCOMPA 4 MG PO TABS: 4.0000 mg | ORAL_TABLET | Freq: Every day | ORAL | 5 refills | Status: DC

## 2021-11-12 NOTE — Progress Notes (Signed)
Virtual Visit via Video Note The purpose of this virtual visit is to provide medical care while limiting exposure to the novel coronavirus.    Consent was obtained for video visit:  Yes.   Answered questions that patient had about telehealth interaction:  Yes.   I discussed the limitations, risks, security and privacy concerns of performing an evaluation and management service by telemedicine. I also discussed with the patient that there may be a patient responsible charge related to this service. The patient expressed understanding and agreed to proceed.  Pt location: Home Physician Location: office Name of referring provider:  [facility] I connected with Wendy Stephenson at patients initiation/request on 11/12/2021 at  8:30 AM EDT by video enabled telemedicine application and verified that I am speaking with the correct person using two identifiers. Pt MRN:  [id number] Pt DOB:  1990-05-05 Video Participants:  [name]   History of Present Illness:  The patient had a virtual video visit on 11/12/2021. She was last seen in the neurology clinic 8 months ago for intractable epilepsy. She is currently undergoing presurgical evaluation at the [facility], records were reviewed. She had phase I monitoring in July 2021 with one seizure with nonlateralizing onset. She was admitted from February 20-26, 2023 with one seizure captured with diffuse onset with right-sided prominence that clinically appeared to be a focal to generalized tonic-clonic seizure. Interictal EEG was normal. She could not stay longer due to childcare issues. She recently had Neuropsychological testing.   She reported 3-4 seizures in May, and called Dr. Margret Chance to report 2 seizures on 6/16. She reports she has been doing pretty well, her last seizure was a month ago. Around 2 weeks ago she felt a little dizziness like she would have a seizure but did not go into one. She is on 5 ASMs, Lacosamide 200mg  BID,  Clobazam 20mg  qhs, Lamotrigine 100mg  in AM, 150mg  in PM, Depakote ER 1500mg  qhs, and Perampanel 4mg  qhs. She feels a little drowsy on her regimen. She denies any significant headaches, dizziness, vision changes, focal numbness/tingling/weakness, no falls. She gets 9-10 hours of sleep. Mood is okay. She lives with her son and significant other . She does not drive.   She had an MRI brain with and without contrast at [facility] on 03/06/21 reporting subtle increased FLAIR signal in the right anteromedial temporal lobe, including amygdala, hippocampus, parahippocampal gyrus, which may reflect a combination of post-ictal cellular edema and/or gliosis. In retrospect, this area of signal hyperintensity may also correlate to a subtle area of interictal hypometabolism in the anterior right temporal lobe on recent PET CT.  History on Initial Assessment 10/29/2017: This is a pleasant 31 year old right-handed woman with no significant past medical history, in her usual state of health until 04/27/17 when she recalls feeling warm, she went to turn on the fan and then to the bathroom, then her boyfriend heard a fall and saw her having a full body convulsion lasting 2 minutes. She was confused after, no focal weakness. She bit her tongue, no incontinence. Bloodwork showed an elevated WBC of 19.1, normal BMP. I personally reviewed CT head without contrast which was normal. She had another episode while sleeping on 07/09/17. She woke up feeling odd, dizzy like she would pass out. She sat up then started having a convulsion lasting 2 minutes. She was again brought to the ER, WBC was 20, BMP normal. I personally reviewed MRI brain with and without contrast  which was normal, hippocampi symmetric with no abnormal signal or enhancement seen. She was discharged home on Keppra 500mg  BID but had itching that was not relieved with Benadryl, so she stopped the medication. She was back in the ER on 08/21/17 with another convulsion and was  discharged home on Dilantin. She was back on 10/01/17 with another seizure out of sleep, she woke up feeling hot, tried to go back to sleep, then woke up in the ambulance. Her boyfriend again noted a convulsive seizure. Dilantin level was undetectable. Her last seizure was on 10/01/17, she took a nap at noon and woke up feeling lightheaded like she would pass out, her boyfriend assisted her to the commode and she had a convulsion with tongue bite. Seizure lasted 2 minutes. Her Dilantin level was 7.3. She was discharged home on Dilantin 400mg  qhs. Her boyfriend reports some seizures are associated with head turn to the left side. She has occasional episodes where she feels she will have a seizure with the dizziness occurring 1-2 times a month, but it does not progress. Last episode was 2 days ago. Her boyfriend feels she would stare off after taking the Dilantin, it takes her a while to respond. She denies any gaps in time, olfactory/gustatory hallucinations, deja vu, rising epigastric sensation, focal numbness/tingling/weakness. She has occasional body jerks. She feels weird on the Dilantin, but denies any dizziness, diplopia, gait instability. She denies any headaches, neck/back pain, bowel/bladder dysfunction.    Epilepsy Risk Factors:  Her paternal uncle had epilepsy. Otherwise she had a normal birth and early development.  There is no history of febrile convulsions, CNS infections such as meningitis/encephalitis, significant traumatic brain injury, neurosurgical procedures.   Prior AEDs: Keppra (itching), Zonisamide (itching), Dilantin, Aptiom, Briviact (mood swings)  Diagnostic Data:  MRI brain with and without contrast done 06/2017: No acute changes. Hippocampi symmetric with no abnormal signal or enhancement seen. 1-hour wake and sleep EEG done 10/2017: Rare focal slowing over the right temporal region in sleep Video EEG [facility] EMU admission (10/26/19-11/01/19): One episode on 10/29/19 0210. Patient shuffling  in bed, subtle bicycling leg movements, right arm stretches out, adjusts covers and goes back to sleep. Nonlateralizable theta rhythm develops over bitemporal regions and spreads quickly over bilateral head regions. Interictal EEG: Rare R temporal sharp waves   Current Outpatient Medications on File Prior to Visit  Medication Sig Dispense Refill   cloBAZam (ONFI) 20 MG tablet Take 1 tablet every night 30 tablet 5   divalproex (DEPAKOTE ER) 500 MG 24 hr tablet Take 3 tablets every night 90 tablet 11   Etonogestrel (IMPLANON [place]) Inject into the skin.     FYCOMPA 2 MG tablet Take 1 tablet (2 mg total) by mouth at bedtime. 30 tablet 5   lacosamide (VIMPAT) 200 MG TABS tablet Take 1 tablet (200 mg total) by mouth 2 (two) times daily. 60 tablet 5   lamoTRIgine (LAMICTAL) 100 MG tablet Take 1 tablet (100 mg total) by mouth 2 times daily at 12 noon and 4 pm. 60 tablet 11   No current facility-administered medications on file prior to visit.     Observations/Objective:   Vitals:   11/12/21 0749  Weight: 243 lb (110.2 kg)  Height: 5\' 3"  (1.6 m)   GEN:  The patient appears stated age and is in NAD.  Neurological examination: Patient is awake, alert. No aphasia or dysarthria. Intact fluency and comprehension. Cranial nerves: Extraocular movements intact. No facial asymmetry. Motor: moves all extremities symmetrically, at  least anti-gravity x 4.    Assessment and Plan:   This is a pleasant 31 yo RH woman with intractable epilepsy on 5 ASMs. She is undergoing presurgical evaluation at [facility], scheduled for another phase I admission in September 2023. She reports her last seizure was last month. Continue current ASMs, she gets refills from [facility] but requests for a refill for Fycompa 4mg  qhs today as she will be running out soon. She does not drive. Follow-up after [facility] evaluation, she knows to call for any changes.    Follow Up Instructions:   -I discussed the assessment and treatment plan with the  patient. The patient was provided an opportunity to ask questions and all were answered. The patient agreed with the plan and demonstrated an understanding of the instructions.   The patient was advised to call back or seek an in-person evaluation if the symptoms worsen or if the condition fails to improve as anticipated.     , MD

## 2021-11-12 NOTE — Patient Instructions (Signed)
Good to see you. Continue all your medications. Follow-up at [facility] as scheduled. Call our office after your evaluation at [facility] is completed to schedule follow-up.   Seizure Precautions: 1. If medication has been prescribed for you to prevent seizures, take it exactly as directed.  Do not stop taking the medicine without talking to your doctor first, even if you have not had a seizure in a long time.   2. Avoid activities in which a seizure would cause danger to yourself or to others.  Don't operate dangerous machinery, swim alone, or climb in high or dangerous places, such as on ladders, roofs, or girders.  Do not drive unless your doctor says you may.  3. If you have any warning that you may have a seizure, lay down in a safe place where you can't hurt yourself.    4.  No driving for 6 months from last seizure, as per [facility].   Please refer to the following link on the Epilepsy Foundation of America's website for more information: http://www.epilepsyfoundation.org/answerplace/Social/driving/drivingu.cfm   5.  Maintain good sleep hygiene. Avoid alcohol.  6.  Notify your neurology if you are planning pregnancy or if you become pregnant.  7.  Contact your doctor if you have any problems that may be related to the medicine you are taking.  8.  Call 911 and bring the patient back to the ED if:        A.  The seizure lasts longer than 5 minutes.       B.  The patient doesn't awaken shortly after the seizure  C.  The patient has new problems such as difficulty seeing, speaking or moving  D.  The patient was injured during the seizure  E.  The patient has a temperature over 102 F (39C)  F.  The patient vomited and now is having trouble breathing

## 2022-02-12 ENCOUNTER — Other Ambulatory Visit: Payer: Self-pay | Admitting: Neurology

## 2022-03-12 ENCOUNTER — Telehealth: Payer: Self-pay | Admitting: Neurology

## 2022-03-12 NOTE — Telephone Encounter (Signed)
I received a call from [place], she wanted to know if we had went to dr.aquino and gave her the papers she needs signed. I told her the forms were in dr.aquino's box and a nurse will check it. She stated she was in the dentist chair and needs these signed now. I told her this type of forms have to be sent to doctor within a week or two before appts etc. She said I need it now. I again let her know the process. She said can't you take it to dr.aquino? I said no b/c she is in the room with a patient. She has pts every 30 min or 1 hr. She said well I will just come up there and bring it in to have it signed. I told her if she brought it in today, she was not going to get it done today. I was sorry but that is protocol for everyone. She will have to resch. The patient disconnected the phone. Shortly after this, the patient showed up in the office. While standing at the front desk, she saw dr.aquino in the clinic and started yelling her name. After a small conversation with the front desk, she left the office.

## 2022-03-17 NOTE — Telephone Encounter (Signed)
Pt called informed that paperwork was faxed on Friday, she would like a call back to get a follow up appointment scheduled with Dr Karel Jarvis ,

## 2022-03-17 NOTE — Telephone Encounter (Signed)
Patient is calling back to see if the paperwork for her dentist was faxed  or ready please call patient

## 2022-03-17 NOTE — Telephone Encounter (Signed)
Lmom  to get patient sch for follow ujpappt with [facility]

## 2022-03-19 NOTE — Telephone Encounter (Signed)
Pt is sch for 04-29-22 with Dr Karel Jarvis

## 2022-04-29 ENCOUNTER — Encounter: Payer: Self-pay | Admitting: Neurology

## 2022-04-29 ENCOUNTER — Ambulatory Visit: Payer: Medicaid Other | Admitting: Neurology

## 2022-04-29 VITALS — BP 133/85 | HR 86 | Ht 62.0 in | Wt 257.4 lb

## 2022-04-29 DIAGNOSIS — G40019 Localization-related (focal) (partial) idiopathic epilepsy and epileptic syndromes with seizures of localized onset, intractable, without status epilepticus: Secondary | ICD-10-CM

## 2022-04-29 MED ORDER — FYCOMPA 4 MG PO TABS: 4.0000 mg | ORAL_TABLET | Freq: Every day | ORAL | 5 refills | Status: DC

## 2022-04-29 MED ORDER — DIVALPROEX SODIUM ER 500 MG PO TB24: ORAL_TABLET | ORAL | 11 refills | Status: DC

## 2022-04-29 MED ORDER — CLOBAZAM 20 MG PO TABS: ORAL_TABLET | ORAL | 5 refills | Status: DC

## 2022-04-29 MED ORDER — LACOSAMIDE 200 MG PO TABS: 200.0000 mg | ORAL_TABLET | Freq: Two times a day (BID) | ORAL | 5 refills | Status: DC

## 2022-04-29 MED ORDER — LAMOTRIGINE 100 MG PO TABS: ORAL_TABLET | ORAL | 11 refills | Status: DC

## 2022-04-29 NOTE — Patient Instructions (Addendum)
Always good to see you.   Increase Lamotrigine 100mg : take 1 and 1/2 tablets twice a day  2. Continue all your other medications  3. Follow-up in 4 months, call for any changes   Seizure Precautions: 1. If medication has been prescribed for you to prevent seizures, take it exactly as directed.  Do not stop taking the medicine without talking to your doctor first, even if you have not had a seizure in a long time.   2. Avoid activities in which a seizure would cause danger to yourself or to others.  Don't operate dangerous machinery, swim alone, or climb in high or dangerous places, such as on ladders, roofs, or girders.  Do not drive unless your doctor says you may.  3. If you have any warning that you may have a seizure, lay down in a safe place where you can't hurt yourself.    4.  No driving for 6 months from last seizure, as per [facility].   Please refer to the following link on the [facility] website for more information: http://www.epilepsyfoundation.org/answerplace/Social/driving/drivingu.cfm   5.  Maintain good sleep hygiene. Avoid alcohol.  6.  Notify your neurology if you are planning pregnancy or if you become pregnant.  7.  Contact your doctor if you have any problems that may be related to the medicine you are taking.  8.  Call 911 and bring the patient back to the ED if:        A.  The seizure lasts longer than 5 minutes.       B.  The patient doesn't awaken shortly after the seizure  C.  The patient has new problems such as difficulty seeing, speaking or moving  D.  The patient was injured during the seizure  E.  The patient has a temperature over 102 F (39C)  F.  The patient vomited and now is having trouble breathing

## 2022-04-29 NOTE — Progress Notes (Signed)
NEUROLOGY FOLLOW UP OFFICE NOTE  PHOEBIE SHAD [id number] July 03, 1990  HISTORY OF PRESENT ILLNESS: I had the pleasure of seeing Wendy Stephenson in follow-up in the neurology clinic on 04/29/2022.  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 She is again accompanied by her significant other Debby Bud who helps supplement the history today.  Records and images were personally reviewed where available.  Since her last visit, they continue to report 2-3 seizures a month. She had phase I monitoring in July 2021 with one seizure with nonlateralizing onset. She was admitted from February 20-26, 2023 with one seizure captured with diffuse onset with right-sided prominence that clinically appeared to be a focal to generalized tonic-clonic seizure. Interictal EEG was normal. She could not stay longer due to childcare issues, and has been unable to schedule recommended EMU admission due to childcare issues. Her son is 33, they may schedule in the summer. Last seizure was 04/20/2022. She has had 2 this month (1/2 and 1/7). She bit her tongue with one of them. They report the last seizure was a small one, she was shaking then came right out of it, no injuries. She has not had any falls with the recent seizures. She continues on Lacosamide 200mg  BID, Clobazam 20mg  qhs, Lamotrigine 100mg  in AM, 150mg  in PM, Depakote ER 1500mg  qhs, and Perampanel 4mg  qhs. She denies any headaches. She has occasional dizziness, mostly prior to a seizure. She notes her emotions are up and down. If she gets frustrated or mad, she gets really frustrated or mad. If she gets happy, she gets very happy.   History on Initial Assessment 10/29/2017: This is a pleasant 32 year old right-handed woman with no significant past medical history, in her usual state of health until 04/27/17 when she recalls feeling warm, she went to turn on the fan and then to the bathroom, then her boyfriend heard a fall and saw her having a full body convulsion  lasting 2 minutes. She was confused after, no focal weakness. She bit her tongue, no incontinence. Bloodwork showed an elevated WBC of 19.1, normal BMP. I personally reviewed CT head without contrast which was normal. She had another episode while sleeping on 07/09/17. She woke up feeling odd, dizzy like she would pass out. She sat up then started having a convulsion lasting 2 minutes. She was again brought to the ER, WBC was 20, BMP normal. I personally reviewed MRI brain with and without contrast which was normal, hippocampi symmetric with no abnormal signal or enhancement seen. She was discharged home on Keppra 500mg  BID but had itching that was not relieved with Benadryl, so she stopped the medication. She was back in the ER on 08/21/17 with another convulsion and was discharged home on Dilantin. She was back on 10/01/17 with another seizure out of sleep, she woke up feeling hot, tried to go back to sleep, then woke up in the ambulance. Her boyfriend again noted a convulsive seizure. Dilantin level was undetectable. Her last seizure was on 10/01/17, she took a nap at noon and woke up feeling lightheaded like she would pass out, her boyfriend assisted her to the commode and she had a convulsion with tongue bite. Seizure lasted 2 minutes. Her Dilantin level was 7.3. She was discharged home on Dilantin 400mg  qhs. Her boyfriend reports some seizures are associated with head turn to the left side. She has occasional episodes where she feels she will have a seizure with the dizziness occurring 1-2 times  a month, but it does not progress. Last episode was 2 days ago. Her boyfriend feels she would stare off after taking the Dilantin, it takes her a while to respond. She denies any gaps in time, olfactory/gustatory hallucinations, deja vu, rising epigastric sensation, focal numbness/tingling/weakness. She has occasional body jerks. She feels weird on the Dilantin, but denies any dizziness, diplopia, gait instability. She  denies any headaches, neck/back pain, bowel/bladder dysfunction.    Epilepsy Risk Factors:  Her paternal uncle had epilepsy. Otherwise she had a normal birth and early development.  There is no history of febrile convulsions, CNS infections such as meningitis/encephalitis, significant traumatic brain injury, neurosurgical procedures.   Prior AEDs: Keppra (itching), Zonisamide (itching), Dilantin, Aptiom, Briviact (mood swings)  Diagnostic Data:  MRI brain with and without contrast done 06/2017: No acute changes. Hippocampi symmetric with no abnormal signal or enhancement seen. 1-hour wake and sleep EEG done 10/2017: Rare focal slowing over the right temporal region in sleep Video EEG [facility] admission (10/26/19-11/01/19): One episode on 10/29/19 0210. Patient shuffling in bed, subtle bicycling leg movements, right arm stretches out, adjusts covers and goes back to sleep. Nonlateralizable theta rhythm develops over bitemporal regions and spreads quickly over bilateral head regions. Interictal EEG: Rare R temporal sharp waves [facility]U admission (05/2021): One event out of sleep with lip smacking, head turn to left, bilateral upper and lower extremities tonic posture that evolved to tonic-clonic movement with bilateral blinking and facial twitching was captured with diffuse onset with right sided prominence that clinically appeared to be a focal to generalized tonic clonic seizure.   MRI brain with and without contrast at [facility] on 03/06/21 reported subtle increased FLAIR signal in the right anteromedial temporal lobe, including amygdala, hippocampus, parahippocampal gyrus, which may reflect a combination of post-ictal cellular edema and/or gliosis. In retrospect, this area of signal hyperintensity may also correlate to a subtle area of interictal hypometabolism in the anterior right temporal lobe on recent PET CT.  PAST MEDICAL HISTORY: Past Medical History:  Diagnosis Date   Seizures ([place])      MEDICATIONS: Current Outpatient Medications on File Prior to Visit  Medication Sig Dispense Refill   cloBAZam (ONFI) 20 MG tablet Take 1 tablet every night 30 tablet 5   divalproex (DEPAKOTE ER) 500 MG 24 hr tablet Take 3 tablets every night 90 tablet 11   Etonogestrel (IMPLANON [place]) Inject into the skin.     lacosamide (VIMPAT) 200 MG TABS tablet Take 1 tablet (200 mg total) by mouth 2 (two) times daily. 60 tablet 5   lamoTRIgine (LAMICTAL) 100 MG tablet Take 1 tablet (100 mg total) by mouth 2 times daily at 12 noon and 4 pm. (Patient taking differently: Take 100 mg by mouth 2 times daily at 12 noon and 4 pm. Take 1 tablet in AM, 1.5 tablets in PM) 60 tablet 11   Perampanel (FYCOMPA) 4 MG TABS Take 1 tablet (4 mg total) by mouth at bedtime. Take 1 tablet every night 30 tablet 5   No current facility-administered medications on file prior to visit.    ALLERGIES: Allergies  Allergen Reactions   Levetiracetam Itching and Other (See Comments)    Mostly feet, hands too a little bit    FAMILY HISTORY: Family History  Problem Relation Age of Onset   Prostate cancer Father     SOCIAL HISTORY: Social History   Socioeconomic History   Marital status: Single    Spouse name: Not on file   Number  of children: 1   Years of education: Not on file   Highest education level: Not on file  Occupational History   Not on file  Tobacco Use   Smoking status: Former    Types: Cigarettes   Smokeless tobacco: Never  Vaping Use   Vaping Use: Former  Substance and Sexual Activity   Alcohol use: Yes    Comment: rarely   Drug use: Not Currently    Types: Marijuana   Sexual activity: Yes    Birth control/protection: None  Other Topics Concern   Not on file  Social History Narrative   Pt lives in 1 story home with her boyfriend and son   Has 1 son   Left handed   Some college education   Currently unemployed, 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 No aphasia or dysarthria. Fund of knowledge is appropriate.  Attention and concentration are normal.   Cranial nerves: Pupils equal, round. Extraocular movements intact with no nystagmus. Visual fields full.  No facial asymmetry.  Motor: Bulk and tone normal, muscle strength 5/5 throughout with no pronator drift.   Finger to nose testing intact.  Gait narrow-based and steady, able to tandem walk adequately.  Romberg negative.   IMPRESSION: This is a pleasant 32 yo RH woman with intractable epilepsy on 5 ASMs. She continues to report 2-3 seizures a month, as well as mood swings. She has been unable to proceed with planned EMU admission due to childcare issues, hopefully they can do it in the summer. In the meantime, we discussed increasing Lamotrigine to 150mg  BID. Continue Lacosamide 200mg  BID, Clobazam 20mg  qhs, Depakote ER 1500mg  qhs, and Perampanel 4mg  qhs. She does not drive. Follow-up in 4 months, call for any changes.    Thank you for allowing me to participate in her care.  Please do not hesitate to call for any questions or concerns.    Ellouise Newer, M.D.   CC: [facility]

## 2022-06-18 ENCOUNTER — Other Ambulatory Visit: Payer: Self-pay | Admitting: Neurology

## 2022-07-02 ENCOUNTER — Telehealth: Payer: Self-pay | Admitting: Neurology

## 2022-07-02 NOTE — Telephone Encounter (Signed)
Pt spouse called back no answer left a voice mail to call back

## 2022-07-02 NOTE — Telephone Encounter (Signed)
Pt c/o: seizure Missed medications?  No. Sleep deprived?  Yes.   It hard at times to get to sleep  Alcohol intake?  No. Increased stress? Yes.   Any change in medication color or shape? No. Any triggers? Felt dizzy before Back to their usual baseline self?  No.. If no, advise go to ER Current medications prescribed by Dr. Delice Lesch:  Onfi 20mg   Take 1 tablet every night,  Divalproex 500 mg Take 3 tablets every night  Lacosamide 200mg  Take 1 tablet (200 mg total) by mouth 2 (two) times daily  Lamotrigine 100mg  Take 1 and 1/2 tablets twice a day  Fycompa 4 mg TAKE 1 TABLET(4 MG) BY MOUTH EVERY NIGHT AT BEDTIME

## 2022-07-02 NOTE — Telephone Encounter (Signed)
Pt's husband called in stating the pt had a seizure yesterday. He is not sure if she needs to change anything?

## 2022-07-06 NOTE — Telephone Encounter (Signed)
Pls see how she feels about increasing one of her medications. We can increase Fycompa to 6mg  every night.

## 2022-07-07 NOTE — Telephone Encounter (Signed)
Pt stated that she dose not really want to increase her medication, she stated with last increase she had seizures, pt was advised that Dr Delice Lesch wanted to increase the Fycompa to 6 mg pt stated that if Dr Delice Lesch thinks she needs to increase the medication she is ok with it,

## 2022-07-07 NOTE — Telephone Encounter (Signed)
Pt called an informed We can hold off on increase for now. Call if having an increase in seizures, we could also potentially increase the clobazam to 30mg  qhs, monitoring for drowsiness

## 2022-07-07 NOTE — Telephone Encounter (Signed)
We can hold off on increase for now. Call if having an increase in seizures, we could also potentially increase the clobazam to 30mg  qhs,  monitoring for drowsiness. Thanks

## 2022-08-29 ENCOUNTER — Encounter: Payer: Self-pay | Admitting: Neurology

## 2022-08-29 ENCOUNTER — Telehealth (INDEPENDENT_AMBULATORY_CARE_PROVIDER_SITE_OTHER): Payer: Medicaid Other | Admitting: Neurology

## 2022-08-29 VITALS — Ht 64.0 in | Wt 250.0 lb

## 2022-08-29 DIAGNOSIS — G40019 Localization-related (focal) (partial) idiopathic epilepsy and epileptic syndromes with seizures of localized onset, intractable, without status epilepticus: Secondary | ICD-10-CM | POA: Diagnosis not present

## 2022-08-29 MED ORDER — LAMOTRIGINE 100 MG PO TABS: ORAL_TABLET | ORAL | 11 refills | Status: DC

## 2022-08-29 MED ORDER — LACOSAMIDE 200 MG PO TABS: 200.0000 mg | ORAL_TABLET | Freq: Two times a day (BID) | ORAL | 5 refills | Status: DC

## 2022-08-29 MED ORDER — PERAMPANEL 6 MG PO TABS: ORAL_TABLET | ORAL | 5 refills | Status: DC

## 2022-08-29 MED ORDER — DIVALPROEX SODIUM ER 500 MG PO TB24: ORAL_TABLET | ORAL | 11 refills | Status: DC

## 2022-08-29 MED ORDER — CLOBAZAM 20 MG PO TABS: ORAL_TABLET | ORAL | 5 refills | Status: DC

## 2022-08-29 NOTE — Patient Instructions (Signed)
Good to see you.   Increase Perampanel to 6mg : Take 1 tablet every night  2. Continue all your other medications  3. Proceed with further evaluation as scheduled at [facility]  4. Follow-up in 3 months, call for any changes   Seizure Precautions: 1. If medication has been prescribed for you to prevent seizures, take it exactly as directed.  Do not stop taking the medicine without talking to your doctor first, even if you have not had a seizure in a long time.   2. Avoid activities in which a seizure would cause danger to yourself or to others.  Don't operate dangerous machinery, swim alone, or climb in high or dangerous places, such as on ladders, roofs, or girders.  Do not drive unless your doctor says you may.  3. If you have any warning that you may have a seizure, lay down in a safe place where you can't hurt yourself.    4.  No driving for 6 months from last seizure, as per [facility].   Please refer to the following link on the Epilepsy Foundation of America's website for more information: http://www.epilepsyfoundation.org/answerplace/Social/driving/drivingu.cfm   5.  Maintain good sleep hygiene. Avoid alcohol.  6.  Notify your neurology if you are planning pregnancy or if you become pregnant.  7.  Contact your doctor if you have any problems that may be related to the medicine you are taking.  8.  Call 911 and bring the patient back to the ED if:        A.  The seizure lasts longer than 5 minutes.       B.  The patient doesn't awaken shortly after the seizure  C.  The patient has new problems such as difficulty seeing, speaking or moving  D.  The patient was injured during the seizure  E.  The patient has a temperature over 102 F (39C)  F.  The patient vomited and now is having trouble breathing

## 2022-08-29 NOTE — Progress Notes (Signed)
Virtual Visit via Video Note The purpose of this virtual visit is to provide medical care while limiting exposure to the novel coronavirus.    Consent was obtained for video visit:  Yes.   Answered questions that patient had about telehealth interaction:  Yes.     Pt location: Home Physician Location: office Name of referring provider:  [facility] I connected with ALIESE MAYWEATHER at patients initiation/request on 08/29/2022 at 11:30 AM EDT by video enabled telemedicine application and verified that I am speaking with the correct person using two identifiers. Pt MRN:  [id number] Pt DOB:  [birth year] Video Participants:  [name];  [name] (significant other)   History of Present Illness:  The patient had a virtual video visit on 08/29/2022. She was last seen 4 months ago for intractable epilepsy. Her significant other Debby Bud is present to provide additional information. Since her last visit, they contacted our office about a seizure in March. She reports a seizure 2 weeks ago where she fell, last seizure was 08/27/22 both with shaking per Debby Bud. She continues to report an average of 2-3 seizures a month. She is on Lacosamide 200mg  BID, Clobazam 20mg  qhs, Lamotrigine 100mg  in AM, 150mg  in PM, Depakote ER 1500mg  qhs, and Perampanel 4mg  qhs. She has some drowsiness but overall tolerating current regimen. She is scheduled for further presurgical evaluation at [facility] in October.    History on Initial Assessment 10/29/2017: This is a pleasant 32 year old right-handed woman with no significant past medical history, in her usual state of health until 04/27/17 when she recalls feeling warm, she went to turn on the fan and then to the bathroom, then her boyfriend heard a fall and saw her having a full body convulsion lasting 2 minutes. She was confused after, no focal weakness. She bit her tongue, no incontinence. Bloodwork showed an elevated WBC of 19.1, normal BMP. I personally reviewed  CT head without contrast which was normal. She had another episode while sleeping on 07/09/17. She woke up feeling odd, dizzy like she would pass out. She sat up then started having a convulsion lasting 2 minutes. She was again brought to the ER, WBC was 20, BMP normal. I personally reviewed MRI brain with and without contrast which was normal, hippocampi symmetric with no abnormal signal or enhancement seen. She was discharged home on Keppra 500mg  BID but had itching that was not relieved with Benadryl, so she stopped the medication. She was back in the ER on 08/21/17 with another convulsion and was discharged home on Dilantin. She was back on 10/01/17 with another seizure out of sleep, she woke up feeling hot, tried to go back to sleep, then woke up in the ambulance. Her boyfriend again noted a convulsive seizure. Dilantin level was undetectable. Her last seizure was on 10/01/17, she took a nap at noon and woke up feeling lightheaded like she would pass out, her boyfriend assisted her to the commode and she had a convulsion with tongue bite. Seizure lasted 2 minutes. Her Dilantin level was 7.3. She was discharged home on Dilantin 400mg  qhs. Her boyfriend reports some seizures are associated with head turn to the left side. She has occasional episodes where she feels she will have a seizure with the dizziness occurring 1-2 times a month, but it does not progress. Last episode was 2 days ago. Her boyfriend feels she would stare off after taking the Dilantin, it takes her a while to respond. She denies  any gaps in time, olfactory/gustatory hallucinations, deja vu, rising epigastric sensation, focal numbness/tingling/weakness. She has occasional body jerks. She feels weird on the Dilantin, but denies any dizziness, diplopia, gait instability. She denies any headaches, neck/back pain, bowel/bladder dysfunction.    Epilepsy Risk Factors:  Her paternal uncle had epilepsy. Otherwise she had a normal birth and early  development.  There is no history of febrile convulsions, CNS infections such as meningitis/encephalitis, significant traumatic brain injury, neurosurgical procedures.   Prior AEDs: Keppra (itching), Zonisamide (itching), Dilantin, Aptiom, Briviact (mood swings)  Diagnostic Data:  MRI brain with and without contrast done 06/2017: No acute changes. Hippocampi symmetric with no abnormal signal or enhancement seen. 1-hour wake and sleep EEG done 10/2017: Rare focal slowing over the right temporal region in sleep Video EEG [facility]U admission (10/26/19-11/01/19): One episode on 10/29/19 0210. Patient shuffling in bed, subtle bicycling leg movements, right arm stretches out, adjusts covers and goes back to sleep. Nonlateralizable theta rhythm develops over bitemporal regions and spreads quickly over bilateral head regions. Interictal EEG: Rare R temporal sharp waves [facility]U admission (05/2021): One event out of sleep with lip smacking, head turn to left, bilateral upper and lower extremities tonic posture that evolved to tonic-clonic movement with bilateral blinking and facial twitching was captured with diffuse onset with right sided prominence that clinically appeared to be a focal to generalized tonic clonic seizure.   MRI brain with and without contrast at [facility] on 03/06/21 reported subtle increased FLAIR signal in the right anteromedial temporal lobe, including amygdala, hippocampus, parahippocampal gyrus, which may reflect a combination of post-ictal cellular edema and/or gliosis. In retrospect, this area of signal hyperintensity may also correlate to a subtle area of interictal hypometabolism in the anterior right temporal lobe on recent PET CT.    Current Outpatient Medications on File Prior to Visit  Medication Sig Dispense Refill   cloBAZam (ONFI) 20 MG tablet Take 1 tablet every night 30 tablet 5   divalproex (DEPAKOTE ER) 500 MG 24 hr tablet Take 3 tablets every night 90 tablet 11   Etonogestrel  (IMPLANON [place]) Inject into the skin.     lacosamide (VIMPAT) 200 MG TABS tablet Take 1 tablet (200 mg total) by mouth 2 (two) times daily. 60 tablet 5   lamoTRIgine (LAMICTAL) 100 MG tablet Take 1 and 1/2 tablets twice a day 90 tablet 11   Perampanel (FYCOMPA) 4 MG TABS TAKE 1 TABLET(4 MG) BY MOUTH EVERY NIGHT AT BEDTIME 30 tablet 5   No current facility-administered medications on file prior to visit.     Observations/Objective:   Vitals:   08/29/22 0933  Weight: 250 lb (113.4 kg)  Height: 5\' 4"  (1.626 m)   GEN:  The patient appears stated age and is in NAD.  Neurological examination: Patient is awake, alert. No aphasia or dysarthria. Intact fluency and comprehension.Cranial nerves: Extraocular movements intact with no nystagmus. No facial asymmetry. Motor: moves all extremities symmetrically, at least anti-gravity x 4.    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 She continues to report 2-3 seizures a month, most recently 08/27/22. She is scheduled for further presurgical evaluation at [facility] in October. In the meantime, she is agreeable to trying an increase in Perampanel to 6mg  qhs, monitor for sedation. Continue Lamotrigine to 150mg  BID, Lacosamide 200mg  BID, Clobazam 20mg  qhs, and Depakote ER 1500mg  qhs. She does not drive. Follow-up in 3 months, call for any  changes.    Follow Up Instructions:   -I discussed the assessment and treatment plan with the patient. The patient was provided an opportunity to ask questions and all were answered. The patient agreed with the plan and demonstrated an understanding of the instructions.   The patient was advised to call back or seek an in-person evaluation if the symptoms worsen or if the condition fails to improve as anticipated.     Van Clines, MD

## 2022-11-04 ENCOUNTER — Telehealth: Payer: Self-pay | Admitting: Neurology

## 2022-11-04 NOTE — Telephone Encounter (Signed)
Patient is calling about papers that need to be faxed over for her court appointment, for disability. Court is Friday.  Fax number [phone] child support inforcement.

## 2022-11-05 NOTE — Telephone Encounter (Signed)
We all can look in clinic right now

## 2022-11-05 NOTE — Telephone Encounter (Signed)
Left message to call office in the am, papers never received, phone just rang and voicemail came on.

## 2022-11-05 NOTE — Telephone Encounter (Signed)
Looking for them, unable to find them

## 2022-11-05 NOTE — Telephone Encounter (Signed)
The patient called and LM with AN. She needs to know if the forms have been faxed, b/c they need to faxed by Friday. She also has a new phone number and it has been updated

## 2022-11-05 NOTE — Telephone Encounter (Signed)
Called pt and made her aware of Dr. Karel Jarvis and Herbert Seta is out of office, We have looked for papers that were faxed to the court. Pt reported that the courts did not receive them. I told her we would continue to look and talk with our office manager in the AM.

## 2022-11-06 NOTE — Telephone Encounter (Addendum)
Patient says she will call back in a few weeks when Dr.Aquino returns, offered to help with papers, if she could get them here. Again, papers were never received at the office.

## 2022-12-02 ENCOUNTER — Ambulatory Visit: Payer: Medicaid Other | Admitting: Neurology

## 2022-12-02 ENCOUNTER — Telehealth: Payer: Self-pay | Admitting: Neurology

## 2022-12-02 NOTE — Telephone Encounter (Signed)
Pt is calling to see if she can be worked in soon her transportation was not able to bring her in today.  Pt would like to have a call back.

## 2022-12-02 NOTE — Telephone Encounter (Signed)
Called patient please make sure she is on the  CX list

## 2023-03-16 ENCOUNTER — Other Ambulatory Visit: Payer: Self-pay | Admitting: Neurology

## 2023-06-23 ENCOUNTER — Ambulatory Visit: Payer: Medicaid Other | Admitting: Neurology

## 2023-06-23 ENCOUNTER — Encounter: Payer: Self-pay | Admitting: Neurology

## 2023-07-07 ENCOUNTER — Other Ambulatory Visit: Payer: Self-pay | Admitting: Neurology

## 2023-07-08 NOTE — Telephone Encounter (Signed)
 I advised to patient to schedule an appt with Dr.Karen Karel Jarvis. No further refills will be sent. Advised of no show policy as well. She thanked me and was going to schedule an appt.

## 2023-09-20 ENCOUNTER — Other Ambulatory Visit: Payer: Self-pay | Admitting: Neurology

## 2023-09-22 ENCOUNTER — Other Ambulatory Visit: Payer: Self-pay | Admitting: Neurology

## 2023-09-28 ENCOUNTER — Other Ambulatory Visit: Payer: Self-pay | Admitting: Neurology

## 2023-09-28 ENCOUNTER — Telehealth: Payer: Self-pay

## 2023-09-28 NOTE — Telephone Encounter (Signed)
 Pt c/o: seizure Missed medications?  Yes out of fycompa  and onfi  for 2 day waiting for the pharmacy to get them ready ,  Sleep deprived?  Yes.   Sometimes she doesn't sleep good  Alcohol intake?  No. Increased stress? No. Any change in medication color or shape? No. Any trigger? No she was sleeping? Back to their usual baseline self?  Yes.  . If no, advise go to ER Current medications prescribed by Dr. Ty Gales:  Perampanel  (FYCOMPA ) 6 MG TABS TAKE 1 TABLET BY MOUTH EVERY NIGHT  lamoTRIgine  (LAMICTAL ) 100 MG tablet  Take 1 and 1/2 tablets twice a day,  lacosamide  (VIMPAT ) 200 MG TABS tablet TAKE 1 TABLET(200 MG) BY MOUTH TWICE DAILY  divalproex  (DEPAKOTE  ER) 500 MG 24 hr tablet TAKE 3 TABLETS BY MOUTH EVERY NIGHT  cloBAZam  (ONFI ) 20 MG tablet TAKE 1 TABLET BY MOUTH EVERY NIGHT   Pt was taken a nap and had a seizure, last one before this one was May 7th she was sleeping as well. He was advised to call the pharmacy to check on the 2 medications,  If she has any more seizures take her to the hospital for evaluation ,

## 2023-09-29 NOTE — Telephone Encounter (Signed)
 Pls check with [facility] if they got her meds. We don't have Onfi  samples, but we have some Fycompa  he can pass by if still no meds. Can we get from a different pharmacy if still not available? Thanks

## 2023-09-29 NOTE — Telephone Encounter (Signed)
 Pt called she is going to have [facility] come by and get samples of fycompa  tomorrow and I gave her the information to call [facility] to set up a profile so we can send RX to them

## 2023-10-01 ENCOUNTER — Other Ambulatory Visit: Payer: Self-pay | Admitting: Neurology

## 2023-10-02 NOTE — Telephone Encounter (Signed)
 Left message with the after hour service on 09-14-23   Caller states that his wife had requested for a refill she is at the pharmacy and only 1 prescription was filled asking if she can get some of the trail medication

## 2023-10-02 NOTE — Telephone Encounter (Signed)
 They are going to call when they pick up last rx from [facility] then they want everthing changed to [place], they would like samples of fycompa 

## 2023-10-02 NOTE — Telephone Encounter (Signed)
 Medication Samples have been provided to the patient.  Drug name: fycompa        Strength: 2mp         Qty: 2  LOT: [id number]  Exp.Date: 1/26  Dosing instructions: pt takes 6mg   at night   The patient has been instructed regarding the correct time, dose, and frequency of taking this medication, including desired effects and most common side effects.   Wendy Stephenson 3:12 PM 10/02/2023

## 2023-10-14 ENCOUNTER — Other Ambulatory Visit: Payer: Self-pay

## 2023-10-14 ENCOUNTER — Other Ambulatory Visit (HOSPITAL_COMMUNITY): Payer: Self-pay

## 2023-10-14 MED ORDER — LAMOTRIGINE 100 MG PO TABS
ORAL_TABLET | ORAL | 3 refills | Status: AC
  Filled 2023-10-14: qty 270, fill #0

## 2023-10-14 MED ORDER — CLOBAZAM 20 MG PO TABS
20.0000 mg | ORAL_TABLET | Freq: Every day | ORAL | 3 refills | Status: DC
  Filled 2023-10-14: qty 90, 90d supply, fill #0

## 2023-10-14 MED ORDER — DIVALPROEX SODIUM ER 500 MG PO TB24
ORAL_TABLET | ORAL | 3 refills | Status: DC
  Filled 2023-10-14: qty 270, 90d supply, fill #0

## 2023-10-14 MED ORDER — LACOSAMIDE 200 MG PO TABS
200.0000 mg | ORAL_TABLET | Freq: Two times a day (BID) | ORAL | 3 refills | Status: DC
  Filled 2023-10-14: qty 180, 90d supply, fill #0

## 2023-10-14 MED ORDER — PERAMPANEL 6 MG PO TABS
1.0000 | ORAL_TABLET | Freq: Every day | ORAL | 3 refills | Status: AC
  Filled 2023-10-14: qty 30, 30d supply, fill #0

## 2023-10-14 NOTE — Addendum Note (Signed)
 Addended by: GEORJEAN DARICE HERO on: 10/14/2023 02:34 PM   Modules accepted: Orders

## 2023-10-14 NOTE — Telephone Encounter (Signed)
 Just checking if they picked up last Rx at [facility] and should I send to [facility] now? Thanks

## 2023-10-14 NOTE — Telephone Encounter (Signed)
 Pt has all RX from [facility] they would like to use [facility] now for all medications from our office

## 2023-10-14 NOTE — Telephone Encounter (Signed)
Sent to [facility]

## 2023-10-15 ENCOUNTER — Other Ambulatory Visit (HOSPITAL_COMMUNITY): Payer: Self-pay

## 2023-10-15 ENCOUNTER — Other Ambulatory Visit: Payer: Self-pay

## 2023-10-19 ENCOUNTER — Other Ambulatory Visit: Payer: Self-pay

## 2023-10-19 ENCOUNTER — Encounter: Payer: Self-pay | Admitting: Pharmacist

## 2023-10-21 ENCOUNTER — Other Ambulatory Visit: Payer: Self-pay

## 2023-10-22 ENCOUNTER — Other Ambulatory Visit: Payer: Self-pay

## 2023-11-23 ENCOUNTER — Other Ambulatory Visit: Payer: Self-pay | Admitting: Neurology

## 2023-11-24 NOTE — Telephone Encounter (Signed)
 Left VM for pt to call back. If pt calls back, please see what pharmacy pt prefers.

## 2023-11-24 NOTE — Telephone Encounter (Signed)
 Please advise. Pt's last OV 08/29/22, Pt has an appt scheduled 01/29/24

## 2023-11-25 NOTE — Telephone Encounter (Signed)
 Pt wants her Rx to stay at [facility].

## 2023-12-24 ENCOUNTER — Ambulatory Visit: Admitting: Family Medicine

## 2023-12-24 ENCOUNTER — Encounter: Payer: Self-pay | Admitting: Family Medicine

## 2023-12-24 VITALS — BP 118/78 | HR 76 | Temp 98.1°F | Ht 63.0 in | Wt 265.0 lb

## 2023-12-24 DIAGNOSIS — E66813 Obesity, class 3: Secondary | ICD-10-CM | POA: Diagnosis not present

## 2023-12-24 DIAGNOSIS — R5383 Other fatigue: Secondary | ICD-10-CM

## 2023-12-24 DIAGNOSIS — Z6841 Body Mass Index (BMI) 40.0 and over, adult: Secondary | ICD-10-CM | POA: Diagnosis not present

## 2023-12-24 DIAGNOSIS — Z1159 Encounter for screening for other viral diseases: Secondary | ICD-10-CM

## 2023-12-24 DIAGNOSIS — Z114 Encounter for screening for human immunodeficiency virus [HIV]: Secondary | ICD-10-CM

## 2023-12-24 DIAGNOSIS — Z124 Encounter for screening for malignant neoplasm of cervix: Secondary | ICD-10-CM

## 2023-12-24 DIAGNOSIS — R569 Unspecified convulsions: Secondary | ICD-10-CM | POA: Insufficient documentation

## 2023-12-24 LAB — LIPID PANEL
Cholesterol: 198 mg/dL (ref 0–200)
HDL: 43.4 mg/dL (ref >39.00)
LDL Cholesterol: 139 mg/dL — ABNORMAL HIGH (ref 0–99)
NonHDL: 154.98
Total CHOL/HDL Ratio: 5
Triglycerides: 82 mg/dL (ref 0.0–149.0)
VLDL: 16.4 mg/dL (ref 0.0–40.0)

## 2023-12-24 LAB — COMPREHENSIVE METABOLIC PANEL WITH GFR
ALT: 19 U/L (ref 0–35)
AST: 21 U/L (ref 0–37)
Albumin: 3.8 g/dL (ref 3.5–5.2)
Alkaline Phosphatase: 43 U/L (ref 39–117)
BUN: 5 mg/dL — ABNORMAL LOW (ref 6–23)
CO2: 27 meq/L (ref 19–32)
Calcium: 8.8 mg/dL (ref 8.4–10.5)
Chloride: 103 meq/L (ref 96–112)
Creatinine, Ser: 0.81 mg/dL (ref 0.40–1.20)
GFR: 94.94 mL/min (ref >60.00)
Glucose, Bld: 91 mg/dL (ref 70–99)
Potassium: 4.3 meq/L (ref 3.5–5.1)
Sodium: 138 meq/L (ref 135–145)
Total Bilirubin: 0.3 mg/dL (ref 0.2–1.2)
Total Protein: 6.9 g/dL (ref 6.0–8.3)

## 2023-12-24 LAB — CBC WITH DIFFERENTIAL/PLATELET
Basophils Absolute: 0 K/uL (ref 0.0–0.1)
Basophils Relative: 0.5 % (ref 0.0–3.0)
Eosinophils Absolute: 0.1 K/uL (ref 0.0–0.7)
Eosinophils Relative: 1.2 % (ref 0.0–5.0)
HCT: 37.9 % (ref 36.0–46.0)
Hemoglobin: 12.5 g/dL (ref 12.0–15.0)
Lymphocytes Relative: 39.3 % (ref 12.0–46.0)
Lymphs Abs: 3.7 K/uL (ref 0.7–4.0)
MCHC: 32.9 g/dL (ref 30.0–36.0)
MCV: 87.5 fl (ref 78.0–100.0)
Monocytes Absolute: 0.9 K/uL (ref 0.1–1.0)
Monocytes Relative: 9.9 % (ref 3.0–12.0)
Neutro Abs: 4.6 K/uL (ref 1.4–7.7)
Neutrophils Relative %: 49.1 % (ref 43.0–77.0)
Platelets: 248 K/uL (ref 150.0–400.0)
RBC: 4.34 Mil/uL (ref 3.87–5.11)
RDW: 14.2 % (ref 11.5–15.5)
WBC: 9.4 K/uL (ref 4.0–10.5)

## 2023-12-24 LAB — HEMOGLOBIN A1C: Hgb A1c MFr Bld: 6 % (ref 4.6–6.5)

## 2023-12-24 LAB — VITAMIN B12: Vitamin B-12: 221 pg/mL (ref 211–911)

## 2023-12-24 LAB — VITAMIN D 25 HYDROXY (VIT D DEFICIENCY, FRACTURES): VITD: <7 ng/mL — ABNORMAL LOW (ref 30.00–100.00)

## 2023-12-24 LAB — TSH: TSH: 1.71 u[IU]/mL (ref 0.35–5.50)

## 2023-12-24 NOTE — Progress Notes (Unsigned)
 New Patient Office Visit  Subjective   Patient ID: Wendy Stephenson, female    DOB: March 26, 1991  Age: 33 y.o. MRN: [id number]  CC:  Chief Complaint  Patient presents with   Establish Care    HPI GLADIES SOFRANKO presents to establish care with new provider.   Patients previous primary care provider: [facility] in [place]. Last seen year ago.   Specialist: [facility] with Dr. Jorie Glass  [facility] with Dr. Darice Shivers   Patient complains of shaking in whole body, but mostly in hands and has crackling voice. During stressful and unstressful times. She repots this has been going since starting to have seizure, around 2018. She reports neurology has placed her on medications for these symptoms before without any help.   Also, complains about fatigue. Unsure if it is related to the medications she is on for her seizures, but always tired for a long time.  Outpatient Encounter Medications as of 12/24/2023  Medication Sig   cloBAZam  (ONFI ) 20 MG tablet Take 1 tablet (20 mg total) by mouth at bedtime.   divalproex  (DEPAKOTE  ER) 500 MG 24 hr tablet TAKE 3 TABLETS BY MOUTH EVERY NIGHT   Etonogestrel (IMPLANON [place]) Inject into the skin.   lacosamide  (VIMPAT ) 200 MG TABS tablet TAKE 1 TABLET(200 MG) BY MOUTH TWICE DAILY   lamoTRIgine  (LAMICTAL ) 100 MG tablet Take 1 and 1/2 tablets twice a day   Perampanel  (FYCOMPA ) 6 MG TABS Take 1 tablet by mouth at bedtime.   No facility-administered encounter medications on file as of 12/24/2023.    Past Medical History:  Diagnosis Date   Seizures (HCC)     History reviewed. No pertinent surgical history.  Family History  Problem Relation Age of Onset   Prostate cancer Father     Social History   Socioeconomic History   Marital status: Single    Spouse name: Not on file   Number of children: 1   Years of education: Not on file   Highest education level: Some college, no degree   Occupational History   Not on file  Tobacco Use   Smoking status: Former    Types: Cigarettes   Smokeless tobacco: Never  Vaping Use   Vaping status: Former  Substance and Sexual Activity   Alcohol use: Yes    Comment: 1-2 times a month   Drug use: Not Currently    Types: Marijuana   Sexual activity: Yes    Birth control/protection: Implant  Other Topics Concern   Not on file  Social History Narrative   Pt lives in 1 story home with her boyfriend and son   Has 1 son   Left handed   Some college education   Currently unemployed, 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   Overall Financial Resource Strain (CARDIA)    Difficulty of Paying Living Expenses: Very hard  Food Insecurity: No Food Insecurity (02/22/2023)   Received from [facility]   Hunger Vital Sign    Within the past 12 months, you worried that your food would run out before you got the money to buy more.: Never true    Within the past 12 months, the food you bought just  didn't last and you didn't have money to get more.: Never true  Transportation Needs: Unmet Transportation Needs (02/22/2023)   Received from [facility] - Transportation    In the past 12 months, has lack of transportation kept you from medical appointments or from getting medications?: Yes    Lack of Transportation (Non-Medical): Not on file  Physical Activity: Insufficiently Active (12/24/2023)   Exercise Vital Sign    Days of Exercise per Week: 3 days    Minutes of Exercise per Session: 20 min  Stress: Stress Concern Present (12/24/2023)   [employer] of Occupational Health - Occupational Stress Questionnaire    Feeling of Stress: To some extent  Social Connections: Moderately Integrated  (12/24/2023)   Social Connection and Isolation Panel    Frequency of Communication with Friends and Family: More than three times a week    Frequency of Social Gatherings with Friends and Family: Twice a week    Attends Religious Services: 1 to 4 times per year    Active Member of [employer] or Organizations: No    Attends Banker Meetings: Never    Marital Status: Married  Catering manager Violence: Not At Risk (12/24/2023)   Humiliation, Afraid, Rape, and Kick questionnaire    Fear of Current or Ex-Partner: No    Emotionally Abused: No    Physically Abused: No    Sexually Abused: No    ROS See HPI above    Objective  BP 118/78   Pulse 76   Temp 98.1 F (36.7 C) (Oral)   Ht 5' 3 (1.6 m)   Wt 265 lb (120.2 kg)   LMP 12/07/2023 (Exact Date)   SpO2 98%   BMI 46.94 kg/m   Physical Exam Vitals reviewed.  Constitutional:      General: She is not in acute distress.    Appearance: Normal appearance. She is obese. She is not ill-appearing, toxic-appearing or diaphoretic.  Eyes:     General:        Right eye: No discharge.        Left eye: No discharge.     Conjunctiva/sclera: Conjunctivae normal.  Cardiovascular:     Rate and Rhythm: Normal rate and regular rhythm.     Heart sounds: Normal heart sounds. No murmur heard.    No friction rub. No gallop.  Pulmonary:     Effort: Pulmonary effort is normal. No respiratory distress.     Breath sounds: Normal breath sounds.  Musculoskeletal:        General: Normal range of motion.  Skin:    General: Skin is warm and dry.  Neurological:     General: No focal deficit present.     Mental Status: She is alert and oriented to person, place, and time. Mental status is at baseline.  Psychiatric:        Mood and Affect: Mood normal.        Behavior: Behavior normal.        Thought Content: Thought content normal.        Judgment: Judgment normal.      Assessment & Plan:  Class 3 severe obesity due to excess calories  without serious comorbidity with body mass index (BMI) of 45.0 to 49.9 in adult -     CBC with Differential/Platelet -     Comprehensive metabolic panel with GFR -     Hemoglobin A1c -     Lipid panel -  TSH  Fatigue, unspecified type -     CBC with Differential/Platelet -     Comprehensive metabolic panel with GFR -     TSH -     Vitamin B12 -     VITAMIN D  25 Hydroxy (Vit-D Deficiency, Fractures)  Cervical cancer screening -     Ambulatory referral to Gynecology  Encounter for screening for HIV -     HIV Antibody (routine testing w rflx)  Need for hepatitis C screening test -     Hepatitis C antibody  1.Review health maintenance:  -Cervical cancer screening: Referral to GYN  -Covid booster: Declines  -Hep B vaccines: Unsure  -HIV and Hep C screening: Order labs -HPV screening: Thinks she did -Influenza vaccine: Declines  2.Ordered labs (CBC, CMP, TSH, Vitamin B12, Vitamin D , lipid panel) based on BMI-obesity and complaints of fatigue.  3. Follow up in 2 weeks to discuss any concerns with lab and other concerns. She needed to leave due to her ride being ready.  Return in about 2 weeks (around 01/07/2024) for follow-up.   Brenda Cowher, NP

## 2023-12-25 ENCOUNTER — Ambulatory Visit: Payer: Self-pay | Admitting: Family Medicine

## 2023-12-25 ENCOUNTER — Other Ambulatory Visit: Payer: Self-pay | Admitting: Family Medicine

## 2023-12-25 DIAGNOSIS — E559 Vitamin D deficiency, unspecified: Secondary | ICD-10-CM

## 2023-12-25 LAB — HIV ANTIBODY (ROUTINE TESTING W REFLEX)
HIV 1&2 Ab, 4th Generation: NONREACTIVE
HIV FINAL INTERPRETATION: NEGATIVE

## 2023-12-25 LAB — HEPATITIS C ANTIBODY: Hepatitis C Ab: NONREACTIVE

## 2023-12-25 MED ORDER — VITAMIN D3 1.25 MG (50000 UT) PO CAPS: 1.2500 mg | ORAL_CAPSULE | ORAL | 0 refills | Status: AC

## 2023-12-25 NOTE — Patient Instructions (Addendum)
-  It was great to see you and look forward to taking care of you. -Placed a referral to GYN. Please call the office or send a MyChart message if you do not receive a phone call or a MyChart message about appointment in 2 weeks.  -Ordered labs. Office will call with lab results and will be available via MyChart.  -Follow up in 2 weeks to discuss any concerns with labs and other concerns.

## 2024-01-08 ENCOUNTER — Ambulatory Visit: Admitting: Family Medicine

## 2024-01-11 ENCOUNTER — Ambulatory Visit: Admitting: Family Medicine

## 2024-01-28 ENCOUNTER — Encounter: Payer: Self-pay | Admitting: Family Medicine

## 2024-01-28 ENCOUNTER — Ambulatory Visit: Admitting: Family Medicine

## 2024-01-28 VITALS — BP 128/82 | HR 70 | Temp 98.2°F | Ht 63.0 in | Wt 263.0 lb

## 2024-01-28 DIAGNOSIS — Z975 Presence of (intrauterine) contraceptive device: Secondary | ICD-10-CM

## 2024-01-28 DIAGNOSIS — E559 Vitamin D deficiency, unspecified: Secondary | ICD-10-CM | POA: Diagnosis not present

## 2024-01-28 DIAGNOSIS — Z124 Encounter for screening for malignant neoplasm of cervix: Secondary | ICD-10-CM

## 2024-01-28 DIAGNOSIS — L729 Follicular cyst of the skin and subcutaneous tissue, unspecified: Secondary | ICD-10-CM

## 2024-01-28 NOTE — Progress Notes (Signed)
   Established Patient Office Visit   Subjective:  Patient ID: Wendy Stephenson, female    DOB: 08/20/1990  Age: 33 y.o. MRN: [id number]  Chief Complaint  Patient presents with   Medical Management of Chronic Issues    2 week follow up    HPI Patient is complaining a lump on her lower abd, above her pelvis. It has been there for about 3 years. Denies any pain. Denies any symptoms of infection.   At last appointment, patient had labs drawn fatigue. Patient has a vitamin D  deficiency. She has not been able to pick up prescribed Vitamin D  strength.   ROS See HPI above     Objective:   BP 128/82   Pulse 70   Temp 98.2 F (36.8 C) (Oral)   Ht 5' 3 (1.6 m)   Wt 263 lb (119.3 kg)   SpO2 96%   BMI 46.59 kg/m    Physical Exam Vitals reviewed.  Constitutional:      General: She is not in acute distress.    Appearance: Normal appearance. She is obese. She is not ill-appearing, toxic-appearing or diaphoretic.  HENT:     Head: Normocephalic and atraumatic.  Eyes:     General:        Right eye: No discharge.        Left eye: No discharge.     Conjunctiva/sclera: Conjunctivae normal.  Cardiovascular:     Rate and Rhythm: Normal rate.  Pulmonary:     Effort: Pulmonary effort is normal. No respiratory distress.  Musculoskeletal:        General: Normal range of motion.  Skin:    General: Skin is warm and dry.     Comments: Non hard, non moveable cyst on lower abd, above pelvic. See picture included.   Neurological:     General: No focal deficit present.     Mental Status: She is alert and oriented to person, place, and time. Mental status is at baseline.  Psychiatric:        Mood and Affect: Mood normal.        Behavior: Behavior normal.        Thought Content: Thought content normal.        Judgment: Judgment normal.       Assessment & Plan:  Cervical cancer screening -     Ambulatory referral to Gynecology  Nexplanon in place -     Ambulatory referral to  Gynecology  Vitamin D  deficiency  Cyst of skin -     Ambulatory referral to Dermatology  -Advised to pease schedule lab appointment to have Vitamin D  redrawn in 3 months from the time she is able to get vitamin D . Alternative: Over the counter, vitamin D3 5,000IU tablet, take 1 tablet daily.  -Resent referral to GYN for another location to have cervical cancer screening (pap smear) and removal of Nexplanon. Please call the office or send a MyChart message if you do not receive a phone call or a MyChart message about appointment in 2 weeks.  -Placed a referral to dermatology for cyst. On exam, no signs of infection. Please call the office or send a MyChart message if you do not receive a phone call or a MyChart message about appointment in 2 weeks.   Return in about 6 months (around 07/28/2024) for physical.   Kongmeng Santoro, NP

## 2024-01-28 NOTE — Patient Instructions (Addendum)
-  It was good to see you today.  -Please schedule lab appointment to have Vitamin D  redrawn in 3 months from the time you are able to get vitamin D . Alternative: Over the counter, vitamin D3 5,000IU tablet, take 1 tablet daily.  -Resent referral to GYN for another location to have cervical cancer screening (pap smear) and removal of Nexplanon. Please call the office or send a MyChart message if you do not receive a phone call or a MyChart message about appointment in 2 weeks.  -Placed a referral to dermatology for cyst. Please call the office or send a MyChart message if you do not receive a phone call or a MyChart message about appointment in 2 weeks.  -Follow up in 6 months for physical and please be fasting.

## 2024-02-01 ENCOUNTER — Other Ambulatory Visit: Payer: Self-pay

## 2024-02-01 DIAGNOSIS — Z975 Presence of (intrauterine) contraceptive device: Secondary | ICD-10-CM

## 2024-02-01 DIAGNOSIS — Z124 Encounter for screening for malignant neoplasm of cervix: Secondary | ICD-10-CM

## 2024-02-05 ENCOUNTER — Ambulatory Visit: Admitting: Neurology

## 2024-02-27 ENCOUNTER — Other Ambulatory Visit: Payer: Self-pay | Admitting: Neurology

## 2024-03-01 ENCOUNTER — Other Ambulatory Visit: Payer: Self-pay | Admitting: Neurology

## 2024-03-02 ENCOUNTER — Encounter: Payer: Self-pay | Admitting: Neurology

## 2024-04-04 ENCOUNTER — Emergency Department (HOSPITAL_COMMUNITY)

## 2024-04-04 ENCOUNTER — Emergency Department (HOSPITAL_COMMUNITY)
Admission: EM | Admit: 2024-04-04 | Discharge: 2024-04-04 | Disposition: A | Discharge status: Home / Self Care | Attending: Emergency Medicine | Admitting: Emergency Medicine

## 2024-04-04 DIAGNOSIS — R569 Unspecified convulsions: Secondary | ICD-10-CM | POA: Insufficient documentation

## 2024-04-04 LAB — URINALYSIS, W/ REFLEX TO CULTURE (INFECTION SUSPECTED)
Bacteria, UA: NONE SEEN
Bilirubin Urine: NEGATIVE
Glucose, UA: NEGATIVE mg/dL
Ketones, ur: 5 mg/dL — AB
Nitrite: NEGATIVE
Protein, ur: NEGATIVE mg/dL
Specific Gravity, Urine: 1.013 (ref 1.005–1.030)
pH: 7 (ref 5.0–8.0)

## 2024-04-04 LAB — CBC WITH DIFFERENTIAL/PLATELET
Abs Immature Granulocytes: 0.04 K/uL (ref 0.00–0.07)
Basophils Absolute: 0 K/uL (ref 0.0–0.1)
Basophils Relative: 0 %
Eosinophils Absolute: 0.1 K/uL (ref 0.0–0.5)
Eosinophils Relative: 1 %
HCT: 36.5 % (ref 36.0–46.0)
Hemoglobin: 11.6 g/dL — ABNORMAL LOW (ref 12.0–15.0)
Immature Granulocytes: 0 %
Lymphocytes Relative: 30 %
Lymphs Abs: 4 K/uL (ref 0.7–4.0)
MCH: 29.4 pg (ref 26.0–34.0)
MCHC: 31.8 g/dL (ref 30.0–36.0)
MCV: 92.6 fL (ref 80.0–100.0)
Monocytes Absolute: 1.2 K/uL — ABNORMAL HIGH (ref 0.1–1.0)
Monocytes Relative: 9 %
Neutro Abs: 7.8 K/uL — ABNORMAL HIGH (ref 1.7–7.7)
Neutrophils Relative %: 60 %
Platelets: 224 K/uL (ref 150–400)
RBC: 3.94 MIL/uL (ref 3.87–5.11)
RDW: 13.9 % (ref 11.5–15.5)
WBC: 13.1 K/uL — ABNORMAL HIGH (ref 4.0–10.5)
nRBC: 0 % (ref 0.0–0.2)

## 2024-04-04 LAB — LIPASE, BLOOD: Lipase: 25 U/L (ref 11–51)

## 2024-04-04 LAB — COMPREHENSIVE METABOLIC PANEL WITH GFR
ALT: 28 U/L (ref 0–44)
AST: 41 U/L (ref 15–41)
Albumin: 4 g/dL (ref 3.5–5.0)
Alkaline Phosphatase: 52 U/L (ref 38–126)
Anion gap: 12 (ref 5–15)
BUN: 9 mg/dL (ref 6–20)
CO2: 23 mmol/L (ref 22–32)
Calcium: 9 mg/dL (ref 8.9–10.3)
Chloride: 101 mmol/L (ref 98–111)
Creatinine, Ser: 0.88 mg/dL (ref 0.44–1.00)
GFR, Estimated: >60 mL/min
Glucose, Bld: 84 mg/dL (ref 70–99)
Potassium: 4.6 mmol/L (ref 3.5–5.1)
Sodium: 136 mmol/L (ref 135–145)
Total Bilirubin: <0.2 mg/dL (ref 0.0–1.2)
Total Protein: 7.2 g/dL (ref 6.5–8.1)

## 2024-04-04 LAB — HCG, SERUM, QUALITATIVE: Preg, Serum: NEGATIVE

## 2024-04-04 LAB — VALPROIC ACID LEVEL: Valproic Acid Lvl: 81 ug/mL (ref 50–100)

## 2024-04-04 LAB — ETHANOL: Alcohol, Ethyl (B): <15 mg/dL

## 2024-04-04 MED ORDER — MIDAZOLAM HCL (PF) 2 MG/2ML IJ SOLN
4.0000 mg | Freq: Once | INTRAMUSCULAR | Status: AC
  Filled 2024-04-04: qty 4

## 2024-04-04 MED ORDER — MIDAZOLAM HCL 2 MG/2ML IJ SOLN
INTRAMUSCULAR | Status: AC
  Filled 2024-04-04: qty 2

## 2024-04-04 MED ORDER — SODIUM CHLORIDE 0.9 % IV BOLUS
500.0000 mL | Freq: Once | INTRAVENOUS | Status: AC
  Administered 2024-04-04: 500 mL via INTRAVENOUS

## 2024-04-04 MED ORDER — MIDAZOLAM HCL 2 MG/2ML IJ SOLN
INTRAMUSCULAR | Status: AC
  Administered 2024-04-04: 4 mg via INTRAVENOUS
  Filled 2024-04-04: qty 2

## 2024-04-04 MED ORDER — CLOBAZAM 10 MG PO TABS
20.0000 mg | ORAL_TABLET | Freq: Once | ORAL | Status: AC
  Administered 2024-04-04: 20 mg via ORAL
  Filled 2024-04-04: qty 2

## 2024-04-04 NOTE — ED Triage Notes (Signed)
 On arrival to room from [facility] pt is drowsy, tearful, and a little disoriented.  She is mildly tremulous but alert and oriented.

## 2024-04-04 NOTE — Discharge Instructions (Signed)
 You have been seen in the emergency department today for a seizure.  Your workup today including labs are within normal limits.  Please follow up with your doctor/neurologist as soon as possible regarding today's emergency department visit and your likely seizure.  As we have discussed it is very important that you do not drive until you have been seen and cleared by your neurologist.  Please drink plenty of fluids, get plenty of sleep and avoid any alcohol or drug use Please return to the emergency department if you have any further seizures which do not respond to medications, or for any other symptoms per se concerning for yourself.

## 2024-04-04 NOTE — ED Triage Notes (Signed)
 Patient bib mother for back to back seizures today. She appeared to be postictal on arrival to ED, she is unable to hold her head up and had to be pulled out of the car by staff.

## 2024-04-04 NOTE — Procedures (Signed)
 Patient Name: MALISSIA RABBANI  MRN: [id number]  Epilepsy Attending: Arlin MALVA Krebs  Referring Physician/Provider: Merrianne Locus, MD  Date: 04/04/2024 Duration: 22.47 mins  Patient history: 33yo F with seizure. EEG to evaluate for seizure  Level of alertness: Awake  AEDs during EEG study: Onfi , Versed   Technical aspects: This EEG study was done with scalp electrodes positioned according to the 10-20 International system of electrode placement. Electrical activity was reviewed with band pass filter of 1-70Hz , sensitivity of 7 uV/mm, display speed of 3mm/sec with a 60Hz  notched filter applied as appropriate. EEG data were recorded continuously and digitally stored.  Video monitoring was available and reviewed as appropriate.  Description: The posterior dominant rhythm consists of 9-10 Hz activity of moderate voltage (25-35 uV) seen predominantly in posterior head regions, symmetric and reactive to eye opening and eye closing. Physiologic photic driving was seen during photic stimulation.  Hyperventilation was not performed.     IMPRESSION: This study is within normal limits. No seizures or epileptiform discharges were seen throughout the recording.  A normal interictal EEG does not exclude the diagnosis of epilepsy.   Naomii Kreger O Raileigh Sabater

## 2024-04-04 NOTE — Progress Notes (Signed)
Stat  EEG complete - results pending.  

## 2024-04-04 NOTE — ED Provider Notes (Signed)
 "  Emergency Department Provider Note   I have reviewed the triage vital signs and the nursing notes.   HISTORY  Chief Complaint Seizures   HPI Wendy Stephenson is a 33 y.o. female with past history of seizures presents to the emergency department with report of 2 breakthrough, back-to-back seizures at work today.  Patient arrives with her boyfriend postictal in the car.  Had to be assisted out by staff.  She recalls going to work and feeling off but notes that is about all she can say about it.  She remains postictal.  Level 5 caveat applies.  Her boyfriend at bedside provides some additional history.  He states that her last breakthrough seizure was approximately 3 weeks prior.  She is compliant with all of her medications but has had difficulty getting the Onfi  refilled and has been out for the past 2 days.  Notes that she has been getting plenty of sleep.  Denies drugs or alcohol.    Past Medical History:  Diagnosis Date   Seizures (HCC)     Review of Systems  Constitutional: No fever/chills Cardiovascular: Denies chest pain. Respiratory: Denies shortness of breath. Gastrointestinal: No abdominal pain.   Neurological: Negative for headaches.  ____________________________________________   PHYSICAL EXAM:  VITAL SIGNS: ED Triage Vitals  Encounter Vitals Group     BP 04/04/24 1513 (!) 133/95     Pulse Rate 04/04/24 1513 78     Resp --      Temp 04/04/24 1513 98.2 F (36.8 C)     Temp Source 04/04/24 1513 Oral     SpO2 04/04/24 1513 98 %   Constitutional: Alert but slightly slow to respond at times. Well appearing and in no acute distress. Occasional shivering type movements and brief staring but responds when her name is called.  Eyes: Conjunctivae are normal.  Head: Atraumatic. Nose: No congestion/rhinnorhea. Mouth/Throat: Mucous membranes are moist. Neck: No stridor.   Cardiovascular: Normal rate, regular rhythm. Good peripheral circulation. Grossly normal  heart sounds.   Respiratory: Normal respiratory effort.  No retractions. Lungs CTAB. Gastrointestinal: Soft and nontender. No distention.  Musculoskeletal: No lower extremity tenderness nor edema. No gross deformities of extremities. Neurologic:  Normal speech and language. No gross focal neurologic deficits are appreciated.  Skin:  Skin is warm, dry and intact. No rash noted.  ____________________________________________   LABS (all labs ordered are listed, but only abnormal results are displayed)  Labs Reviewed  URINALYSIS, W/ REFLEX TO CULTURE (INFECTION SUSPECTED) - Abnormal; Notable for the following components:      Result Value   APPearance HAZY (*)    Hgb urine dipstick SMALL (*)    Ketones, ur 5 (*)    Leukocytes,Ua TRACE (*)    All other components within normal limits  CBC WITH DIFFERENTIAL/PLATELET - Abnormal; Notable for the following components:   WBC 13.1 (*)    Hemoglobin 11.6 (*)    Neutro Abs 7.8 (*)    Monocytes Absolute 1.2 (*)    All other components within normal limits  COMPREHENSIVE METABOLIC PANEL WITH GFR  ETHANOL  HCG, SERUM, QUALITATIVE  LIPASE, BLOOD  VALPROIC ACID  LEVEL  CBC WITH DIFFERENTIAL/PLATELET   ____________________________________________  EKG   EKG Interpretation Date/Time:  Monday April 04 2024 15:35:00 EST Ventricular Rate:  70 PR Interval:    QRS Duration:  88 QT Interval:  395 QTC Calculation: 427 R Axis:   84  Text Interpretation: Atrial fibrillation Minimal ST depression, inferior leads Confirmed by Countryman,  Chase [phone]) on 04/05/2024 5:03:29 PM        ____________________________________________   PROCEDURES  Procedure(s) performed:   Procedures  CRITICAL CARE Performed by: Fonda KANDICE Law Total critical care time: 35 minutes Critical care time was exclusive of separately billable procedures and treating other patients. Critical care was necessary to treat or prevent imminent or life-threatening  deterioration. Critical care was time spent personally by me on the following activities: development of treatment plan with patient and/or surrogate as well as nursing, discussions with consultants, evaluation of patient's response to treatment, examination of patient, obtaining history from patient or surrogate, ordering and performing treatments and interventions, ordering and review of laboratory studies, ordering and review of radiographic studies, pulse oximetry and re-evaluation of patient's condition.  Fonda Law, MD Emergency Medicine  ____________________________________________   INITIAL IMPRESSION / ASSESSMENT AND PLAN / ED COURSE  Pertinent labs & imaging results that were available during my care of the patient were reviewed by me and considered in my medical decision making (see chart for details).   This patient is Presenting for Evaluation of seizure, which does require a range of treatment options, and is a complaint that involves a high risk of morbidity and mortality.  The Differential Diagnoses includes but is not exclusive to alcohol, illicit or prescription medications, intracranial pathology such as stroke, intracerebral hemorrhage, fever or infectious causes including sepsis, hypoxemia, uremia, trauma, endocrine related disorders such as diabetes, hypoglycemia, thyroid -related diseases, etc.   Critical Interventions-    Medications  sodium chloride  0.9 % bolus 500 mL (0 mLs Intravenous Stopped 04/04/24 1641)  midazolam  PF (VERSED ) injection 4 mg (4 mg Intravenous Given 04/04/24 1557)  cloBAZam  (ONFI ) tablet 20 mg (20 mg Oral Given 04/04/24 1753)    Reassessment after intervention: patient returned to baseline.    I did obtain Additional Historical Information from boyfriend, as the patient is post-ictal.  I decided to review pertinent External Data, and in summary confirmed patient's home medications.   Clinical Laboratory Tests Ordered, included pregnancy  negative. No AKI. No anemia. EtOH negative.   Radiologic Tests: Considered neuro imaging but patient with history of similar and returning to mental status baseline. Defer imaging for now.   Cardiac Monitor Tracing which shows NSR.   Consult complete with Neurology, Dr. Lindzen. Plan for STAT EEG.   Medical Decision Making: Summary:  Patient presents emergency department with 2 breakthrough seizures while at work.  Apparently the episodes occurred in fairly quick succession but the total time is unclear.  Boyfriend is relaying the history told by staff at work.  He drove her and himself.  On my assessment she is alert but does appear mildly postictal.  She is able to provide some history.  She does seem to have occasional brief shaking episodes with some staring.  If you call her name she immediately responds.  The boyfriend at bedside states these are not typical of her seizures.  I plan to cover initially with Versed  and will consult neurology.  Reevaluation with update and discussion with patient. She is up and ambulatory. She is requesting discharge. EEG reassuring. Labs reassuring. She will be able to pick up her Rx from the pharmacy tomorrow AM.   Considered admission but patient returned to baseline and stable for discharge.   Patient's presentation is most consistent with acute presentation with potential threat to life or bodily function.   Disposition: discharge  ____________________________________________  FINAL CLINICAL IMPRESSION(S) / ED DIAGNOSES  Final diagnoses:  Seizure-like activity (  HCC)     Note:  This document was prepared using Dragon voice recognition software and may include unintentional dictation errors.  Fonda Law, MD, [facility] Emergency Medicine    [facility]adies Petree, Fonda MATSU, MD 04/10/24 [phone]  "

## 2024-04-28 ENCOUNTER — Other Ambulatory Visit: Payer: Self-pay

## 2024-04-28 MED ORDER — DIVALPROEX SODIUM ER 500 MG PO TB24: ORAL_TABLET | ORAL | 1 refills | Status: AC

## 2024-05-18 ENCOUNTER — Ambulatory Visit
Admission: EM | Admit: 2024-05-18 | Discharge: 2024-05-18 | Disposition: A | Discharge status: Home / Self Care | Source: Home / Self Care

## 2024-05-18 ENCOUNTER — Ambulatory Visit: Payer: Self-pay

## 2024-05-18 ENCOUNTER — Ambulatory Visit

## 2024-05-18 ENCOUNTER — Encounter: Payer: Self-pay | Admitting: *Deleted

## 2024-05-18 DIAGNOSIS — W19XXXA Unspecified fall, initial encounter: Secondary | ICD-10-CM

## 2024-05-18 DIAGNOSIS — S7002XA Contusion of left hip, initial encounter: Secondary | ICD-10-CM

## 2024-05-18 MED ORDER — KETOROLAC TROMETHAMINE 30 MG/ML IJ SOLN
30.0000 mg | Freq: Once | INTRAMUSCULAR | Status: AC
  Administered 2024-05-18: 30 mg via INTRAMUSCULAR

## 2024-05-18 MED ORDER — CYCLOBENZAPRINE HCL 10 MG PO TABS: 10.0000 mg | ORAL_TABLET | Freq: Two times a day (BID) | ORAL | 0 refills | Status: AC | PRN

## 2024-05-18 MED ORDER — DICLOFENAC SODIUM 50 MG PO TBEC: 50.0000 mg | DELAYED_RELEASE_TABLET | Freq: Two times a day (BID) | ORAL | 1 refills | Status: AC

## 2024-05-18 NOTE — ED Provider Notes (Signed)
 " [facility]  Note:  This document was prepared using Dragon voice recognition software and may include unintentional dictation errors.  MRN: [id number] DOB: 1991/02/10  Subjective:   Wendy Stephenson is a 34 y.o. female presenting for evaluation following a fall that occurred yesterday.  Patient states that she slipped and fell on the ice landing on her left hip and left upper leg.  Patient states she is having pain down the lateral leg to her knee and increased pain with ambulation.  Patient took some Tylenol  with minimal relief yesterday.  Patient states that she was unable to go to work today and needs evaluation and a work note.  Patient denies any head injury or loss of consciousness.  Patient has history of epilepsy with no recent seizures since December 2025.  Current Medications[1]   Allergies[2]  Past Medical History:  Diagnosis Date   Seizures (HCC)      History reviewed. No pertinent surgical history.  Family History  Problem Relation Age of Onset   Prostate cancer Father     Social History[3]  ROS Refer to HPI for ROS details.  Objective:   Vitals: BP 130/82 (BP Location: Left Arm)   Pulse 88   Temp 98.9 F (37.2 C) (Oral)   Resp 18   LMP 05/13/2024   SpO2 99%   Physical Exam Vitals and nursing note reviewed.  Constitutional:      General: She is not in acute distress.    Appearance: Normal appearance. She is well-developed. She is not ill-appearing or toxic-appearing.  HENT:     Head: Normocephalic and atraumatic.  Cardiovascular:     Rate and Rhythm: Normal rate.  Pulmonary:     Effort: Pulmonary effort is normal. No respiratory distress.     Breath sounds: No stridor. No wheezing.  Musculoskeletal:        General: Normal range of motion.     Left hip: Tenderness and bony tenderness present. No deformity or crepitus. Normal range of motion. Normal strength.     Left upper leg: Tenderness present. No swelling or bony tenderness.  Skin:     General: Skin is warm and dry.  Neurological:     General: No focal deficit present.     Mental Status: She is alert and oriented to person, place, and time.  Psychiatric:        Mood and Affect: Mood normal.        Behavior: Behavior normal.     Procedures  No results found for this or any previous visit (from the past 24 hours).  DG Hip Unilat With Pelvis 2-3 Views Left Result Date: 05/18/2024 CLINICAL DATA:  Hip pain status post fall. EXAM: DG HIP (WITH OR WITHOUT PELVIS) 2-3V LEFT COMPARISON:  None available FINDINGS: No acute fracture or dislocation. Mild spurring of the LEFT hip joint. Visualized soft tissues are normal. IMPRESSION: No acute abnormality of the LEFT hip. Electronically Signed   By: Aliene Lloyd M.D.   On: 05/18/2024 11:24     Assessment and Plan :     Discharge Instructions       1. Fall, initial encounter (Primary) 2. Contusion of left hip, initial encounter - DG Hip Unilat With Pelvis 2-3 Views Left x-ray performed in UC shows no acute fracture or dislocation of the left hip, no acute bony injury.  Most likely soft tissue contusion. - ketorolac  (TORADOL ) 30 MG/ML injection 30 mg given in UC for acute left hip pain secondary to  fall. - cyclobenzaprine  (FLEXERIL ) 10 MG tablet; Take 1 tablet (10 mg total) by mouth 2 (two) times daily as needed for muscle spasms.  Dispense: 20 tablet; Refill: 0 - diclofenac  (VOLTAREN ) 50 MG EC tablet; Take 1 tablet (50 mg total) by mouth 2 (two) times daily.  Dispense: 30 tablet; Refill: 1  -Continue to monitor symptoms for any change in severity if there is any escalation of current symptoms or development of new symptoms follow-up in ER for further evaluation and management.       Travanti Mcmanus B Casey Fye    [1]  Current Facility-Administered Medications:    ketorolac  (TORADOL ) 30 MG/ML injection 30 mg, 30 mg, Intramuscular, Once, Latwan Luchsinger B, NP  Current Outpatient Medications:    Cholecalciferol (VITAMIN D3)  1.25 MG (50000 UT) CAPS, Take 1 capsule (1.25 mg total) by mouth every 7 (seven) days., Disp: 12 capsule, Rfl: 0   cloBAZam  (ONFI ) 20 MG tablet, TAKE 1 TABLET EVERY NIGHT AT BEDTIME, Disp: 30 tablet, Rfl: 5   cyclobenzaprine  (FLEXERIL ) 10 MG tablet, Take 1 tablet (10 mg total) by mouth 2 (two) times daily as needed for muscle spasms., Disp: 20 tablet, Rfl: 0   diclofenac  (VOLTAREN ) 50 MG EC tablet, Take 1 tablet (50 mg total) by mouth 2 (two) times daily., Disp: 30 tablet, Rfl: 1   divalproex  (DEPAKOTE  ER) 500 MG 24 hr tablet, TAKE 3 TABLETS BY MOUTH EVERY NIGHT, Disp: 270 tablet, Rfl: 1   Etonogestrel (IMPLANON Braden), Inject into the skin., Disp: , Rfl:    lacosamide  (VIMPAT ) 200 MG TABS tablet, TAKE 1 TABLET(200 MG) BY MOUTH TWICE DAILY, Disp: 60 tablet, Rfl: 5   lamoTRIgine  (LAMICTAL ) 100 MG tablet, Take 1 and 1/2 tablets twice a day, Disp: 270 tablet, Rfl: 3   Perampanel  (FYCOMPA ) 6 MG TABS, Take 1 tablet by mouth at bedtime., Disp: 90 tablet, Rfl: 3 [2]  Allergies Allergen Reactions   Levetiracetam  Itching and Other (See Comments)    Mostly feet, hands too a little bit  [3]  Social History Tobacco Use   Smoking status: Former    Types: Cigarettes   Smokeless tobacco: Never  Vaping Use   Vaping status: Former  Substance Use Topics   Alcohol use: Not Currently    Comment: 1-2 times a month   Drug use: Not Currently    Types: Marijuana     Aurea Goodell B, NP 05/18/24 1130  "

## 2024-05-18 NOTE — Telephone Encounter (Signed)
 FYI Only or Action Required?: FYI only for provider: Pt declines appt. Going to same day injury clinic today..  Patient was last seen in primary care on 01/28/2024 by Billy Philippe SAUNDERS, NP.  Called Nurse Triage reporting Hip Injury.  Symptoms began yesterday.  Interventions attempted: Rest, hydration, or home remedies.  Symptoms are: gradually worsening.  Triage Disposition: See Physician Within 24 Hours  Patient/caregiver understands and will follow disposition?: Yes    Fell on ice onto left buttock/hip yesterday. No obvious breaks or injury or open areas. 6-7/10 pain in left buttock/hip. Able to walk around with a limp. No numbness. No redness or swelling. Offered to schedule at home office today. Pt declines . Going to same day injury clinic today. Advised UC or ED for worsening symptoms.    Message from Collins D sent at 05/18/2024  9:44 AM EST  Reason for Triage: Pt fell outside yesterday, pt states she's in severe pain on the left side from hip down and left side butt cheek is in pain as well.   Reason for Disposition  [1] MODERATE pain (e.g., interferes with normal activities, limping) AND [2] high-risk adult (e.g., age > 60 years, osteoporosis, chronic steroid use)    Not high risk but with significant pain after fall  Answer Assessment - Initial Assessment Questions 1. MECHANISM: How did the injury happen? (e.g., twisting injury, direct blow)      Fell on ice 2. ONSET: When did the injury happen? (e.g., minutes, hours ago)      Yesterday 3. LOCATION: Where is the injury located?      Left hip/side 4. APPEARANCE of INJURY: What does the injury look like?  (e.g., deformity of leg)     Appears normal 5. SEVERITY: Can you put weight on that leg? Can you walk?      Able to walk around 6. SIZE: For cuts, bruises, or swelling, ask: How large is it? (e.g., inches or centimeters;  entire joint)      None 7. PAIN: Is there pain? If Yes, ask: How bad is the  pain?   What does it keep you from doing? (Scale 0-10; or none, mild, moderate, severe)     6-7/10 8. TETANUS: For any breaks in the skin, ask: When was your last tetanus booster?     N/a 9. OTHER SYMPTOMS: Do you have any other symptoms?      No 10. PREGNANCY: Is there any chance you are pregnant? When was your last menstrual period?       No. LMP last month  Protocols used: Hip Injury-A-AH

## 2024-05-18 NOTE — ED Triage Notes (Signed)
 States she slipped on ice yesterday and landed on her left hip. States pain goes down her leg. Also her left lateral knee made impact. Able to ambulate to room with steady gait. She took tylenol  last night with some relief. She will also need a note for work.  She has epilepsy and reports last seizure was 03/15/24

## 2024-05-18 NOTE — Discharge Instructions (Signed)
" °  1. Fall, initial encounter (Primary) 2. Contusion of left hip, initial encounter - DG Hip Unilat With Pelvis 2-3 Views Left x-ray performed in UC shows no acute fracture or dislocation of the left hip, no acute bony injury.  Most likely soft tissue contusion. - ketorolac  (TORADOL ) 30 MG/ML injection 30 mg given in UC for acute left hip pain secondary to fall. - cyclobenzaprine  (FLEXERIL ) 10 MG tablet; Take 1 tablet (10 mg total) by mouth 2 (two) times daily as needed for muscle spasms.  Dispense: 20 tablet; Refill: 0 - diclofenac  (VOLTAREN ) 50 MG EC tablet; Take 1 tablet (50 mg total) by mouth 2 (two) times daily.  Dispense: 30 tablet; Refill: 1  -Continue to monitor symptoms for any change in severity if there is any escalation of current symptoms or development of new symptoms follow-up in ER for further evaluation and management. "

## 2024-07-28 ENCOUNTER — Encounter: Admitting: Family Medicine

## 2024-08-31 ENCOUNTER — Ambulatory Visit: Admitting: Neurology
